# Patient Record
Sex: Female | Born: 1965 | Race: Black or African American | Hispanic: No | Smoking: Never smoker
Health system: Southern US, Community
[De-identification: ages and names within clinical notes are randomized; demographics above are authoritative.]

## PROBLEM LIST (undated history)

## (undated) ENCOUNTER — Ambulatory Visit: Payer: No Typology Code available for payment source

## (undated) DIAGNOSIS — E119 Type 2 diabetes mellitus without complications: Secondary | ICD-10-CM

## (undated) DIAGNOSIS — Z923 Personal history of irradiation: Secondary | ICD-10-CM

## (undated) DIAGNOSIS — R319 Hematuria, unspecified: Secondary | ICD-10-CM

## (undated) DIAGNOSIS — M1990 Unspecified osteoarthritis, unspecified site: Secondary | ICD-10-CM

## (undated) DIAGNOSIS — C50919 Malignant neoplasm of unspecified site of unspecified female breast: Secondary | ICD-10-CM

## (undated) DIAGNOSIS — T7840XA Allergy, unspecified, initial encounter: Secondary | ICD-10-CM

## (undated) DIAGNOSIS — I10 Essential (primary) hypertension: Secondary | ICD-10-CM

## (undated) DIAGNOSIS — I447 Left bundle-branch block, unspecified: Secondary | ICD-10-CM

## (undated) DIAGNOSIS — D649 Anemia, unspecified: Secondary | ICD-10-CM

## (undated) HISTORY — DX: Malignant neoplasm of unspecified site of unspecified female breast: C50.919

## (undated) HISTORY — DX: Hematuria, unspecified: R31.9

## (undated) HISTORY — DX: Anemia, unspecified: D64.9

## (undated) HISTORY — DX: Essential (primary) hypertension: I10

## (undated) HISTORY — DX: Unspecified osteoarthritis, unspecified site: M19.90

## (undated) HISTORY — DX: Allergy, unspecified, initial encounter: T78.40XA

---

## 1992-02-26 HISTORY — PX: TUBAL LIGATION: SHX77

## 2003-02-15 ENCOUNTER — Other Ambulatory Visit: Admission: RE | Admit: 2003-02-15 | Discharge: 2003-02-15 | Payer: Self-pay | Admitting: Gynecology

## 2004-07-18 ENCOUNTER — Other Ambulatory Visit: Admission: RE | Admit: 2004-07-18 | Discharge: 2004-07-18 | Payer: Self-pay | Admitting: Gynecology

## 2006-02-20 ENCOUNTER — Other Ambulatory Visit: Admission: RE | Admit: 2006-02-20 | Discharge: 2006-02-20 | Payer: Self-pay | Admitting: Gynecology

## 2007-07-14 ENCOUNTER — Other Ambulatory Visit: Admission: RE | Admit: 2007-07-14 | Discharge: 2007-07-14 | Payer: Self-pay | Admitting: Gynecology

## 2007-09-22 ENCOUNTER — Ambulatory Visit: Payer: Self-pay | Admitting: Internal Medicine

## 2007-09-22 DIAGNOSIS — R110 Nausea: Secondary | ICD-10-CM

## 2007-09-22 DIAGNOSIS — R0989 Other specified symptoms and signs involving the circulatory and respiratory systems: Secondary | ICD-10-CM

## 2007-09-29 ENCOUNTER — Telehealth: Payer: Self-pay | Admitting: Internal Medicine

## 2007-09-30 ENCOUNTER — Telehealth: Payer: Self-pay | Admitting: Internal Medicine

## 2007-09-30 ENCOUNTER — Ambulatory Visit: Payer: Self-pay | Admitting: Internal Medicine

## 2007-09-30 LAB — CONVERTED CEMR LAB
Alkaline Phosphatase: 38 units/L — ABNORMAL LOW (ref 39–117)
Basophils Absolute: 0 10*3/uL (ref 0.0–0.1)
Bilirubin, Direct: 0.1 mg/dL (ref 0.0–0.3)
Calcium: 9.3 mg/dL (ref 8.4–10.5)
Cholesterol: 170 mg/dL (ref 0–200)
Eosinophils Absolute: 0.1 10*3/uL (ref 0.0–0.7)
GFR calc Af Amer: 89 mL/min
GFR calc non Af Amer: 74 mL/min
HCT: 34.3 % — ABNORMAL LOW (ref 36.0–46.0)
HDL: 54.5 mg/dL (ref >39.0)
MCHC: 33.2 g/dL (ref 30.0–36.0)
MCV: 88.2 fL (ref 78.0–100.0)
Monocytes Absolute: 0.9 10*3/uL (ref 0.1–1.0)
Neutro Abs: 4 10*3/uL (ref 1.4–7.7)
Platelets: 276 10*3/uL (ref 150–400)
Potassium: 4.4 meq/L (ref 3.5–5.1)
RDW: 11.4 % — ABNORMAL LOW (ref 11.5–14.6)
Sodium: 139 meq/L (ref 135–145)
T3, Free: 2.8 pg/mL (ref 2.3–4.2)
TSH: 1.25 microintl units/mL (ref 0.35–5.50)
Total CHOL/HDL Ratio: 3.1
Triglycerides: 39 mg/dL (ref 0–149)

## 2007-10-01 ENCOUNTER — Ambulatory Visit: Payer: Self-pay

## 2007-10-05 ENCOUNTER — Telehealth: Payer: Self-pay | Admitting: Internal Medicine

## 2007-10-05 ENCOUNTER — Ambulatory Visit (HOSPITAL_BASED_OUTPATIENT_CLINIC_OR_DEPARTMENT_OTHER): Admission: RE | Admit: 2007-10-05 | Discharge: 2007-10-05 | Payer: Self-pay | Admitting: Internal Medicine

## 2007-10-06 ENCOUNTER — Ambulatory Visit: Payer: Self-pay | Admitting: Internal Medicine

## 2007-10-12 ENCOUNTER — Telehealth: Payer: Self-pay | Admitting: Internal Medicine

## 2007-11-03 ENCOUNTER — Ambulatory Visit: Payer: Self-pay | Admitting: Cardiovascular Disease

## 2007-11-06 ENCOUNTER — Ambulatory Visit: Payer: Self-pay | Admitting: Internal Medicine

## 2007-12-09 ENCOUNTER — Ambulatory Visit: Payer: Self-pay | Admitting: Cardiovascular Disease

## 2008-02-05 ENCOUNTER — Ambulatory Visit: Payer: Self-pay | Admitting: Internal Medicine

## 2008-08-09 ENCOUNTER — Ambulatory Visit (HOSPITAL_BASED_OUTPATIENT_CLINIC_OR_DEPARTMENT_OTHER): Admission: RE | Admit: 2008-08-09 | Discharge: 2008-08-09 | Payer: Self-pay | Admitting: Internal Medicine

## 2008-08-09 ENCOUNTER — Ambulatory Visit: Payer: Self-pay | Admitting: Internal Medicine

## 2008-08-09 ENCOUNTER — Telehealth: Payer: Self-pay | Admitting: Internal Medicine

## 2008-08-09 ENCOUNTER — Ambulatory Visit: Payer: Self-pay | Admitting: Diagnostic Radiology

## 2008-08-09 DIAGNOSIS — M79609 Pain in unspecified limb: Secondary | ICD-10-CM | POA: Insufficient documentation

## 2008-08-15 ENCOUNTER — Encounter: Admission: RE | Admit: 2008-08-15 | Discharge: 2008-08-15 | Payer: Self-pay | Admitting: Orthopaedic Surgery

## 2008-12-21 ENCOUNTER — Ambulatory Visit: Payer: Self-pay | Admitting: Diagnostic Radiology

## 2008-12-21 ENCOUNTER — Ambulatory Visit (HOSPITAL_BASED_OUTPATIENT_CLINIC_OR_DEPARTMENT_OTHER): Admission: RE | Admit: 2008-12-21 | Discharge: 2008-12-21 | Payer: Self-pay | Admitting: Internal Medicine

## 2009-03-17 ENCOUNTER — Ambulatory Visit: Payer: Self-pay | Admitting: Internal Medicine

## 2009-03-17 DIAGNOSIS — K5900 Constipation, unspecified: Secondary | ICD-10-CM | POA: Insufficient documentation

## 2009-03-17 DIAGNOSIS — J069 Acute upper respiratory infection, unspecified: Secondary | ICD-10-CM | POA: Insufficient documentation

## 2010-01-10 ENCOUNTER — Ambulatory Visit (HOSPITAL_BASED_OUTPATIENT_CLINIC_OR_DEPARTMENT_OTHER): Admission: RE | Admit: 2010-01-10 | Discharge: 2010-01-10 | Payer: Self-pay | Admitting: Internal Medicine

## 2010-01-10 ENCOUNTER — Ambulatory Visit: Payer: Self-pay | Admitting: Diagnostic Radiology

## 2010-03-19 ENCOUNTER — Encounter: Payer: Self-pay | Admitting: Internal Medicine

## 2010-03-27 NOTE — Assessment & Plan Note (Signed)
Summary: Cough, congestion, sore chest, lost voice- jr   Vital Signs:  Patient profile:   45 year old female Weight:      174.75 pounds BMI:     29.19 O2 Sat:      100 % on Room air Temp:     98.0 degrees F oral Pulse rate:   74 / minute Pulse rhythm:   regular Resp:     18 per minute BP sitting:   126 / 90  (right arm) Cuff size:   large  Vitals Entered By: Glendell Docker CMA (March 17, 2009 2:07 PM)  O2 Flow:  Room air  Primary Care Provider:  D. Thomos Lemons DO  CC:  Cough.  History of Present Illness:  Cough      This is a 45 year old woman who presents with Cough.  The patient reports productive cough and shortness of breath, but denies wheezing, fever, and hemoptysis.  Associated symtpoms include cold/URI symptoms, sore throat, and nasal congestion.  Ineffective prior treatments have included OTC cough medication.  lost her voice/hoarseness  Allergies (verified): No Known Drug Allergies  Past History:  Past Medical History: Remote thyroiditis GERD      Past Surgical History: Tubal ligation 1994         Family History: CAD - no Stoke - no DM -  sister Breast Ca - no Colon Ca - no Prostate Ca - no Htn - M, Brother Hyperlipidemia - M, Brother      Social History: Occupation:  Systems analyst Divorced - living with significant other 2 children ages 39 and 37 Never Smoked Alcohol use-yes        Physical Exam  General:  alert, well-developed, and well-nourished.   Mouth:  no exudates and pharyngeal erythema.   Neck:  supple, no masses, and no carotid bruits.   Lungs:  normal respiratory effort and normal breath sounds.   Heart:  normal rate, regular rhythm, and no gallop.     Impression & Recommendations:  Problem # 1:  URI (ICD-465.9)  Instructed on symptomatic treatment. Call if symptoms persist or worsen.   Patient Instructions: 1)  Call our office if your symptoms do not  improve or gets worse.  Current Allergies (reviewed  today): No known allergies

## 2010-07-10 NOTE — Assessment & Plan Note (Signed)
[facility]                            CARDIOLOGY OFFICE NOTE   NAME:[name] [name], [name] [name]                        MRN:          [id number]  DATE:11/03/2007                            DOB:          May 20, 1965    PERIPHERAL VASCULAR CLINIC NOTE   HISTORY OF PRESENT ILLNESS:  [name] [name] is a pleasant 45 year old  African American female with no significant past medical history, who  presents today for evaluation of carotid artery disease that was noted  by her primary care physician.  The patient states that she has been in  her normal state of good health and has had no problems other than  occasional mild swelling in her bilateral ankles.  She denies any  exertional chest pain or dyspnea.  She also denies any palpitations,  diaphoresis, dizziness, near-syncope, syncope, orthopnea, paroxysmal  nocturnal dyspnea, or nausea.  During a routine examination in her  primary care physician's office, she was noted to have a bruit in the  right carotid artery.  Subsequent noninvasive testing with bilateral  carotid Dopplers demonstrated possible fibromuscular dysplasia  bilaterally in the internal carotid arteries.  Vessel walls in the  external carotids were noted to be normal.  There was narrowing of the  mid and distal internal carotid arteries with some Doppler color beating  in elevated velocities, which could be suggestive of fibromuscular  dysplasia.  The vertebral arteries were patent with antegrade flow  bilaterally.  The patient has no other complaints at this time.   PAST MEDICAL HISTORY:  A remote thyroid problem that has since resolved.  She also has a history of gastroesophageal reflux disease.   PAST SURGICAL HISTORY:  Only significant for bilateral tubal ligation in  1994.   ALLERGIES:  No known drug allergies.   MEDICATION:  Omeprazole 20 mg twice daily.   SOCIAL HISTORY:  The patient denies the use of tobacco or illicit drugs.  She does use  alcohol on a social basis.  She has 2 children, is  divorced, and is employed as an Airline pilot.   FAMILY HISTORY:  There is no family history of sudden cardiac death,  premature coronary artery disease, or vascular disease that the patient  is aware of.  Her mother is alive and well at the age of 75.  Her father  died at the age of 52 in a car accident.  She has 5 siblings that are  all alive and well without any medical issues.   REVIEW OF SYSTEMS:  As stated in the history of present illness and is  otherwise negative.   PHYSICAL EXAMINATION:  GENERAL:  She is a pleasant, young [place]  American female, in no acute distress.  VITAL SIGNS:  Her weight is 160 pounds.  Blood pressure 120/84, pulse 61  and regular, and respirations 12 and nonlabored.  NECK:  No JVD.  No lymphadenopathy.  No thyromegaly.  I was unable to  auscultate any bruits over the right or left carotid arteries today.  HEENT:  Oropharynx clear.  Mucous membranes moist.  SKIN:  Warm and dry.  LUNGS:  Clear to auscultation bilaterally without wheezes, rhonchi, or  crackles noted.  CARDIOVASCULAR:  Regular rate and rhythm without murmurs, gallops, or  rubs noted.  No lifts or thrills noted.  ABDOMEN:  Soft and nontender.  Bowel sounds present.  EXTREMITIES:  No evidence of bilateral lower extremity edema.  The  dorsalis pedis and posterior tibial pulses are 2+ in the bilateral lower  extremities.  Radial artery pulses are 2+ bilaterally.   DIAGNOSTIC STUDIES:  1. A 12-lead electrocardiogram obtained in our office today shows      normal sinus rhythm with a ventricular rate of 61 beats per minute      and an incomplete right bundle branch block.  His EKG is otherwise      normal.  2. Carotid duplex examination from October 01, 2007, reveals smooth      vessel walls without plaque formation.  There is narrowing of the      mid and distal internal carotid arteries with some color Doppler      beating in elevated  velocities, which could indicate fibromuscular      dysplasia.  The vertebral arteries were patent with antegrade flow      bilaterally.  The internal carotid artery velocities suggest 60-79%      bilateral internal carotid artery stenosis.  The recommendation      from the study was a followup carotid CT angiogram.   ASSESSMENT AND PLAN:  This is a pleasant 45 year old [place] American  female with no significant past medical history, who was incidentally  found to have a right carotid bruit.  Workup of this physical  examination finding revealed possible fibromuscular dysplasia on the  bilateral carotid duplex examination.  At this time, the patient is  asymptomatic.  I have elected to follow up her duplex examination with a  carotid CT angiogram.  The patient will have this done and will return  to see me in my office in 2 to 3 weeks.  I will make no medication  changes during this visit.     Verne Carrow, MD  Electronically Signed    CM/MedQ  DD: 11/03/2007  DT: 11/04/2007  Job #: [id number]   cc:   Barbette Hair. Artist Pais, DO  Rollene Rotunda, MD, [facility]

## 2010-07-10 NOTE — Progress Notes (Signed)
HEALTHCARE                        PERIPHERAL VASCULAR OFFICE NOTE   NAME:[name], [name]                        MRN:          [id number]  DATE:12/09/2007                            DOB:          [birth year]    REFERRING PHYSICIAN:  Barbette Hair. Artist Pais, DO   HISTORY OF PRESENT ILLNESS:  [name] [name] is a pleasant 45 year old  African American female with no significant past medical history, who  was initially seen in our clinic on November 03, 2007, for further  evaluation of an abnormal carotid Doppler examination.  The patient had  been found to have a bruit in the right carotid artery during her  routine examination in her primary care physician's office.  Subsequent  noninvasive testing with bilateral carotid Dopplers demonstrated  possible fibromuscular dysplasia bilaterally in the internal carotid  arteries.  The vessel walls in the external carotids were noted to be  normal.  There was narrowing of the mid and distal internal carotid  arteries with some Doppler color beading in elevated velocities which  was felt to be suggestive of fibromuscular dysplasia.  The vertebral  arteries were patent with antegrade flow bilaterally.  The patient at  the time of her initial visit in September in our office had no  complaints.  She denied any palpitations, diaphoresis, dizziness, near  syncope, syncope, visual changes or headaches.  I elected at that time  to proceed with a CT angiogram of the neck which was performed on  November 06, 2007.  The patient returns today to review the results of  the study.  She is still without any complaints, states that she is in  her normal state of good health.   PAST MEDICAL HISTORY:  Unchanged and is only significant for her history  of gastroesophageal reflux disease.   CURRENT MEDICATIONS:  Omeprazole 20 mg twice daily.   REVIEW OF SYSTEMS:  As stated in the history of present illness and is  otherwise negative.   PHYSICAL EXAMINATION:  VITAL SIGNS:  Blood pressure 120/73, pulse 80 and  regular, respirations 12 and nonlabored, and weight 173 pounds.  GENERAL:  She is a pleasant young [place] American female who is alert  and oriented x3 and is in no acute distress.  PSYCHIATRIC:  Normal mood and affect.  NEUROLOGIC:  No focal deficits.  MUSCULOSKELETAL:  Muscle strength and tone is appropriate.  SKIN:  Warm and dry.  NECK:  No JVD.  No carotid bruits noted.  No lymphadenopathy.  No  thyromegaly.  LUNGS:  Clear to auscultation bilaterally without wheezes, rhonchi or  crackles noted.  CARDIOVASCULAR:  Regular rate and rhythm without murmurs, gallops or  rubs noted.  ABDOMEN:  Soft.  Bowel sounds present.  Nontender.  EXTREMITIES:  No evidence of edema.   DIAGNOSTIC STUDIES:  CT angiography of the neck performed on November 06, 2007, shows that the bilateral carotid and vertebral arteries are  small.  However, there is no focal irregularity to suggest fibromuscular  dysplasia.  There is no focal stenosis and no evidence of  atherosclerosis.  The right internal carotid artery  at the level of the  C1 ring measures 3.1 mm but appears normal otherwise.  The left internal  carotid artery at the level of the C1 ring measures 3.1 mm but is  otherwise normal.   ASSESSMENT AND PLAN:  This is a pleasant 44 year old [place] American  female with past medical history that is only significant for  gastroesophageal reflux disease who was seen initially for an abnormal  carotid Doppler examination.  A CT angiogram of her neck demonstrates  small internal carotid arteries bilaterally but no evidence of stenosis  or atherosclerosis.  There was also no evidence of fibromuscular  dysplasia.  I do not feel that any further workup is necessary at this  time.  I have told the patient that she should alert [place] should she have  any neurological symptoms including transient ischemic attack,  cerebrovascular accident,  change in her vision, headache, dizziness, or  syncopal episodes.  She will be seen in this office on an as needed  basis only.  She will continue to follow up with her primary care  physician Dr. Thomos Lemons as previously scheduled.     Verne Carrow, MD  Electronically Signed    CM/MedQ  DD: 12/09/2007  DT: 12/10/2007  Job #: [id number]   cc:   Barbette Hair. Artist Pais, DO  Luis Abed, MD, [facility]

## 2011-07-26 ENCOUNTER — Encounter: Payer: Self-pay | Admitting: Family

## 2011-07-26 ENCOUNTER — Ambulatory Visit (INDEPENDENT_AMBULATORY_CARE_PROVIDER_SITE_OTHER): Payer: BC Managed Care – PPO | Admitting: Family

## 2011-07-26 VITALS — BP 140/86 | HR 66 | Temp 98.1°F | Resp 12 | Ht 64.26 in | Wt 173.0 lb

## 2011-07-26 DIAGNOSIS — I10 Essential (primary) hypertension: Secondary | ICD-10-CM | POA: Insufficient documentation

## 2011-07-26 DIAGNOSIS — R42 Dizziness and giddiness: Secondary | ICD-10-CM

## 2011-07-26 LAB — CBC WITH DIFFERENTIAL/PLATELET
Basophils Absolute: 0 10*3/uL (ref 0.0–0.1)
Eosinophils Relative: 3 % (ref 0–5)
HCT: 35.5 % — ABNORMAL LOW (ref 36.0–46.0)
Lymphocytes Relative: 30 % (ref 12–46)
Lymphs Abs: 2 10*3/uL (ref 0.7–4.0)
MCV: 86.6 fL (ref 78.0–100.0)
Neutro Abs: 3.7 10*3/uL (ref 1.7–7.7)
Platelets: 370 10*3/uL (ref 150–400)
RBC: 4.1 MIL/uL (ref 3.87–5.11)
RDW: 12.7 % (ref 11.5–15.5)
WBC: 6.7 10*3/uL (ref 4.0–10.5)

## 2011-07-26 LAB — BASIC METABOLIC PANEL WITH GFR
CO2: 28 mEq/L (ref 19–32)
Calcium: 9 mg/dL (ref 8.4–10.5)
Chloride: 106 mEq/L (ref 96–112)
Creat: 0.84 mg/dL (ref 0.50–1.10)
GFR, Est Non African American: 85 mL/min
Sodium: 140 mEq/L (ref 135–145)

## 2011-07-26 MED ORDER — HYDROCHLOROTHIAZIDE 25 MG PO TABS: 25.0000 mg | ORAL_TABLET | Freq: Every day | ORAL | Status: DC

## 2011-07-26 NOTE — Patient Instructions (Addendum)
Please complete your lab work prior to leaving. Follow up in 1 month for blood pressure check and fasting physical.

## 2011-07-26 NOTE — Assessment & Plan Note (Signed)
As she has been symptomatic, reasonable to add HCTZ 25mg  daily and have pt follow back up in 1 month.

## 2011-07-26 NOTE — Progress Notes (Signed)
  Subjective:    Patient ID: [name] [name], female    DOB: 06/08/1965, 46 y.o.   MRN: [id number]  HPI  [name] is a 46 yr old female who presents today with chief complaint of elevated blood pressure. She reports that she has been monitoring her blood pressure at home for the last week and her sbp has been consistently running in the 140's.  She reports no personal hx of HTN but a strong family hx of HTN. Has been trying to watch her sodium.  Denies associated CP or SOB, but has had lightheadedness and dizziness.  Denies sinus pressure/drainage.   Review of Systems See HPI  History reviewed. No pertinent past medical history.  History   Social History  . Marital Status: Divorced    Spouse Name: N/A    Number of Children: 2  . Years of Education: N/A   Occupational History  .     Social History Main Topics  . Smoking status: Never Smoker   . Smokeless tobacco: Never Used  . Alcohol Use: 1.5 - 2.0 oz/week    3-4 drink(s) per week  . Drug Use: Not on file  . Sexually Active: Not on file   Other Topics Concern  . Not on file   Social History Narrative  . No narrative on file    Past Surgical History  Procedure Date  . Tubal ligation 1994    Family History  Problem Relation Age of Onset  . Hypertension Mother   . Heart attack Father   . Diabetes Sister   . Hypertension Brother   . Cancer Neg Hx   . Kidney disease Neg Hx   . Stroke Neg Hx     No Known Allergies  Current Outpatient Prescriptions on File Prior to Visit  Medication Sig Dispense Refill  . hydrochlorothiazide (HYDRODIURIL) 25 MG tablet Take 1 tablet (25 mg total) by mouth daily.  30 tablet  1    BP 140/86  Pulse 66  Temp(Src) 98.1 F (36.7 C) (Oral)  Resp 12  Ht 5' 4.26" (1.632 m)  Wt 173 lb (78.472 kg)  BMI 29.46 kg/m2  SpO2 99%  LMP 06/25/2011       Objective:   Physical Exam  Constitutional: She is oriented to person, place, and time. She appears well-developed and  well-nourished. No distress.  HENT:  Head: Normocephalic and atraumatic.  Mouth/Throat: No posterior oropharyngeal edema or posterior oropharyngeal erythema.       R TM occluded by cerumen.  L TM normal.  Neck:       Slightly prominent thyroid- symmetrical without palpable nodules.   Cardiovascular: Normal rate and regular rhythm.   No murmur heard. Pulmonary/Chest: Effort normal and breath sounds normal. No respiratory distress. She has no wheezes. She has no rales. She exhibits no tenderness.  Neurological: She is alert and oriented to person, place, and time.  Psychiatric: She has a normal mood and affect. Her behavior is normal. Judgment and thought content normal.          Assessment & Plan:

## 2011-07-26 NOTE — Assessment & Plan Note (Signed)
EKG is performed today and notes NSR.  Will obtain BMET, TSH, CBC.

## 2011-07-29 ENCOUNTER — Encounter: Payer: Self-pay | Admitting: Family

## 2011-08-30 ENCOUNTER — Encounter: Payer: BC Managed Care – PPO | Admitting: Family

## 2011-09-06 ENCOUNTER — Encounter: Payer: Self-pay | Admitting: Family

## 2011-09-06 ENCOUNTER — Ambulatory Visit (INDEPENDENT_AMBULATORY_CARE_PROVIDER_SITE_OTHER): Payer: BC Managed Care – PPO | Admitting: Family

## 2011-09-06 VITALS — BP 110/70 | HR 58 | Temp 98.6°F | Resp 16 | Ht 64.0 in | Wt 169.0 lb

## 2011-09-06 DIAGNOSIS — Z0000 Encounter for general adult medical examination without abnormal findings: Secondary | ICD-10-CM | POA: Insufficient documentation

## 2011-09-06 DIAGNOSIS — I10 Essential (primary) hypertension: Secondary | ICD-10-CM

## 2011-09-06 NOTE — Progress Notes (Signed)
Subjective:    Patient ID: [name] [name], female    DOB: 1965-05-30, 46 y.o.   MRN: [id number]  HPI  [name] is a 46 yr old female who presents today for her annual physical.  Mammogram November/december 2012- per GYN. She is trying to exercise 3 x a week- walks x 1 hour.  Diet is good. Switched to water from soda. Tetanus up to date.  Review of Systems  Constitutional: Negative for unexpected weight change.  HENT: Negative for hearing loss and congestion.   Eyes: Negative for visual disturbance.  Respiratory: Negative for cough and shortness of breath.   Cardiovascular: Negative for chest pain.  Gastrointestinal: Negative for nausea, vomiting and diarrhea.  Genitourinary: Negative for dysuria, frequency and menstrual problem.  Musculoskeletal: Negative for myalgias and arthralgias.  Skin: Negative for rash.  Neurological: Negative for headaches.  Hematological: Negative for adenopathy.  Psychiatric/Behavioral:       Denies depression/anxiety   No past medical history on file.  History   Social History  . Marital Status: Divorced    Spouse Name: N/A    Number of Children: 2  . Years of Education: N/A   Occupational History  .     Social History Main Topics  . Smoking status: Never Smoker   . Smokeless tobacco: Never Used  . Alcohol Use: 5.0 oz/week    10 drink(s) per week  . Drug Use: Not on file  . Sexually Active: Not on file   Other Topics Concern  . 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 Tubal ligation 1994    Family History  Problem Relation Age of Onset  . Hypertension Mother   . Heart attack Father   . Diabetes Sister   . Hypertension Brother   . Cancer Neg Hx   . Kidney disease Neg Hx   . Stroke Neg Hx     No Known Allergies  Current Outpatient Prescriptions on  File Prior to Visit  Medication Sig Dispense Refill  . Calcium Carbonate-Vitamin D (CALTRATE 600+D) 600-400 MG-UNIT per tablet Take 1 tablet by mouth 2 (two) times daily.      . hydrochlorothiazide (HYDRODIURIL) 25 MG tablet Take 1 tablet (25 mg total) by mouth daily.  30 tablet  1  . Iron Combinations (IRON COMPLEX PO) Take 1 tablet by mouth daily as needed.      . Multiple Vitamin (MULTIVITAMIN) tablet Take 1 tablet by mouth daily.        BP 110/70  Pulse 58  Temp 98.6 F (37 C) (Oral)  Resp 16  Ht 5\' 4"  (1.626 m)  Wt 169 lb (76.658 kg)  BMI 29.01 kg/m2  SpO2 97%  LMP 08/29/2011       Objective:   Physical Exam  Physical Exam  Constitutional: She is oriented to person, place, and time. She appears well-developed and well-nourished. No distress.  HENT:  Head: Normocephalic and atraumatic.  Right Ear: Tympanic membrane and ear canal normal.  Left Ear: Tympanic membrane and ear canal normal.  Mouth/Throat: Oropharynx is clear and moist.  Eyes: Pupils are equal, round, and reactive to light. No scleral icterus.  Neck: Normal range of motion. No thyromegaly present.  Cardiovascular: Normal rate and regular rhythm.   No murmur heard. Pulmonary/Chest: Effort normal and breath  sounds normal. No respiratory distress. He has no wheezes. She has no rales. She exhibits no tenderness.  Abdominal: Soft. Bowel sounds are normal. He exhibits no distension and no mass. There is no tenderness. There is no rebound and no guarding.  Musculoskeletal: She exhibits no edema.  Lymphadenopathy:    She has no cervical adenopathy.  Neurological: She is alert and oriented to person, place, and time. She exhibits normal muscle tone. Coordination normal.  Skin: Skin is warm and dry.  Psychiatric: She has a normal mood and affect. Her behavior is normal. Judgment and thought content normal.  Breast/pelvic- deferred to GYN    Assessment & Plan:

## 2011-09-06 NOTE — Patient Instructions (Addendum)
Please arrange follow up with GYN for mammogram and pap smear.   Keep up the good work with exercise.  Follow up in 6 months, sooner if problems/concerns.  Complete your blood work prior to leaving.

## 2011-09-06 NOTE — Assessment & Plan Note (Addendum)
BP Readings from Last 3 Encounters:  09/06/11 110/70  07/26/11 140/86  03/17/09 126/90  BP looks much better today on HCTZ- monitor.

## 2011-09-06 NOTE — Assessment & Plan Note (Signed)
Encouraged pt to continue exercise, healthy diet and weight loss.  She will schedule f/u pap/mammo with GYN this fall. Obtain fasting labs.  Immunizations up to date.

## 2011-09-07 LAB — LIPID PANEL
Cholesterol: 201 mg/dL — ABNORMAL HIGH (ref 0–200)
HDL: 59 mg/dL (ref >39)
Total CHOL/HDL Ratio: 3.4 Ratio
Triglycerides: 90 mg/dL (ref <150)

## 2011-09-07 LAB — BASIC METABOLIC PANEL WITH GFR
Chloride: 102 mEq/L (ref 96–112)
GFR, Est African American: 89 mL/min
GFR, Est Non African American: 77 mL/min
Potassium: 4.3 mEq/L (ref 3.5–5.3)
Sodium: 138 mEq/L (ref 135–145)

## 2011-09-07 LAB — URINALYSIS, ROUTINE W REFLEX MICROSCOPIC
Bilirubin Urine: NEGATIVE
Glucose, UA: NEGATIVE mg/dL
Hgb urine dipstick: NEGATIVE
Ketones, ur: NEGATIVE mg/dL
Leukocytes, UA: NEGATIVE
Protein, ur: NEGATIVE mg/dL
pH: 7 (ref 5.0–8.0)

## 2011-09-08 ENCOUNTER — Encounter: Payer: Self-pay | Admitting: Family

## 2012-03-13 ENCOUNTER — Encounter: Payer: Self-pay | Admitting: Family

## 2012-03-13 ENCOUNTER — Ambulatory Visit (INDEPENDENT_AMBULATORY_CARE_PROVIDER_SITE_OTHER): Payer: BC Managed Care – PPO | Admitting: Family

## 2012-03-13 VITALS — BP 130/90 | HR 64 | Temp 98.7°F | Resp 16 | Ht 64.0 in | Wt 169.0 lb

## 2012-03-13 DIAGNOSIS — R110 Nausea: Secondary | ICD-10-CM

## 2012-03-13 DIAGNOSIS — I10 Essential (primary) hypertension: Secondary | ICD-10-CM

## 2012-03-13 LAB — CBC WITH DIFFERENTIAL/PLATELET
Basophils Relative: 0 % (ref 0–1)
Eosinophils Relative: 5 % (ref 0–5)
HCT: 37.1 % (ref 36.0–46.0)
Hemoglobin: 12.2 g/dL (ref 12.0–15.0)
MCHC: 32.9 g/dL (ref 30.0–36.0)
MCV: 86.5 fL (ref 78.0–100.0)
Monocytes Absolute: 0.9 10*3/uL (ref 0.1–1.0)
Monocytes Relative: 12 % (ref 3–12)
Neutro Abs: 3.5 10*3/uL (ref 1.7–7.7)
RDW: 12.8 % (ref 11.5–15.5)

## 2012-03-13 LAB — BASIC METABOLIC PANEL
BUN: 11 mg/dL (ref 6–23)
Chloride: 102 mEq/L (ref 96–112)
Creat: 0.91 mg/dL (ref 0.50–1.10)
Glucose, Bld: 94 mg/dL (ref 70–99)
Potassium: 4.3 mEq/L (ref 3.5–5.3)

## 2012-03-13 LAB — HEPATIC FUNCTION PANEL
ALT: 16 U/L (ref 0–35)
AST: 15 U/L (ref 0–37)
Albumin: 4.3 g/dL (ref 3.5–5.2)
Total Bilirubin: 0.4 mg/dL (ref 0.3–1.2)

## 2012-03-13 MED ORDER — HYDROCHLOROTHIAZIDE 25 MG PO TABS: 25.0000 mg | ORAL_TABLET | Freq: Every day | ORAL | Status: DC

## 2012-03-13 MED ORDER — OMEPRAZOLE 40 MG PO CPDR: 40.0000 mg | DELAYED_RELEASE_CAPSULE | Freq: Every day | ORAL | Status: DC

## 2012-03-13 NOTE — Assessment & Plan Note (Addendum)
New. Obtain cbc, lft and abdominal US to evaluate gallbladder.  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  Recommended trial of omeprazole and that she avoid use of NSAIDS and instead use tylenol prn.

## 2012-03-13 NOTE — Assessment & Plan Note (Signed)
Obtain bmet, resume hctz.

## 2012-03-13 NOTE — Patient Instructions (Addendum)
Please schedule your abdominal ultrasound on the first floor.  Complete the blood work prior to leaving.  Call if nausea worsens or if no improvement in 2-3 days. Follow up in 3 months.

## 2012-03-13 NOTE — Progress Notes (Signed)
  Subjective:    Patient ID: [name] [name], female    DOB: September 08, 1965, 47 y.o.   MRN: [id number]  HPI  [name] is a 47 yr old female who presents today for follow up.  1) HTN- she has been out of hctz for several months. Working on low sodium diet, increasing fruits/veggies.  2) Nausea-  Pt reports post-prandial nausea. Denies gerd. No vomitting.  Denies abdominal pain.  She reports that she has been using NSAIDS regularly due to some hip pain that she has been having.     Review of Systems    see HPI  No past medical history on file.  History   Social History  . Marital Status: Divorced    Spouse Name: N/A    Number of Children: 2  . Years of Education: N/A   Occupational History  .     Social History Main Topics  . Smoking status: Never Smoker   . Smokeless tobacco: Never Used  . Alcohol Use: 5.0 oz/week    10 drink(s) per week  . Drug Use: Not on file  . Sexually Active: Not on file   Other Topics Concern  . Not on file   Social History Narrative   REGULAR EXERCISE:  3 X WEEKLYCAFFEINE USE:  NOShe works at [employer]- senior accountantDivorced2 children (age 79 and 70)    Past Surgical History  Procedure Date  . Tubal ligation 1994    Family History  Problem Relation Age of Onset  . Hypertension Mother   . Heart attack Father   . Diabetes Sister   . Hypertension Brother   . Cancer Neg Hx   . Kidney disease Neg Hx   . Stroke Neg Hx     No Known Allergies  Current Outpatient Prescriptions on File Prior to Visit  Medication Sig Dispense Refill  . Calcium Carbonate-Vitamin D (CALTRATE 600+D) 600-400 MG-UNIT per tablet Take 1 tablet by mouth 2 (two) times daily.      . Iron Combinations (IRON COMPLEX PO) Take 1 tablet by mouth daily as needed.      . Multiple Vitamin (MULTIVITAMIN) tablet Take 1 tablet by mouth daily.      . hydrochlorothiazide (HYDRODIURIL) 25 MG tablet Take 1 tablet (25 mg total) by mouth daily.  30 tablet  1    BP  130/90  Pulse 64  Temp 98.7 F (37.1 C) (Oral)  Resp 16  Ht 5\' 4"  (1.626 m)  Wt 169 lb (76.658 kg)  BMI 29.01 kg/m2  SpO2 99%  LMP 03/13/2012    Objective:   Physical Exam  Constitutional: She is oriented to person, place, and time. She appears well-developed and well-nourished. No distress.  HENT:  Head: Normocephalic and atraumatic.  Cardiovascular: Normal rate and regular rhythm.   No murmur heard. Pulmonary/Chest: Effort normal and breath sounds normal. No respiratory distress. She has no wheezes. She has no rales. She exhibits no tenderness.  Abdominal: Soft. Bowel sounds are normal. She exhibits no distension and no mass. There is no tenderness. There is no rebound and no guarding.  Musculoskeletal: She exhibits no edema.  Neurological: She is alert and oriented to person, place, and time.  Skin: Skin is warm and dry.  Psychiatric: She has a normal mood and affect. Her behavior is normal. Judgment and thought content normal.          Assessment & Plan:

## 2012-03-17 ENCOUNTER — Ambulatory Visit (HOSPITAL_BASED_OUTPATIENT_CLINIC_OR_DEPARTMENT_OTHER)
Admission: RE | Admit: 2012-03-17 | Discharge: 2012-03-17 | Disposition: A | Discharge status: Home / Self Care | Payer: BC Managed Care – PPO | Source: Ambulatory Visit | Attending: Family | Admitting: Family

## 2012-03-17 DIAGNOSIS — R110 Nausea: Secondary | ICD-10-CM | POA: Insufficient documentation

## 2012-06-08 ENCOUNTER — Ambulatory Visit: Payer: BC Managed Care – PPO | Admitting: Family

## 2012-07-07 ENCOUNTER — Other Ambulatory Visit: Payer: Self-pay | Admitting: Gynecology

## 2012-07-07 DIAGNOSIS — R928 Other abnormal and inconclusive findings on diagnostic imaging of breast: Secondary | ICD-10-CM

## 2012-07-22 ENCOUNTER — Ambulatory Visit
Admission: RE | Admit: 2012-07-22 | Discharge: 2012-07-22 | Disposition: A | Discharge status: Home / Self Care | Payer: BC Managed Care – PPO | Source: Ambulatory Visit | Attending: Gynecology | Admitting: Gynecology

## 2012-07-22 DIAGNOSIS — R928 Other abnormal and inconclusive findings on diagnostic imaging of breast: Secondary | ICD-10-CM

## 2013-01-08 ENCOUNTER — Other Ambulatory Visit: Payer: Self-pay | Admitting: Gynecology

## 2013-01-08 DIAGNOSIS — N6002 Solitary cyst of left breast: Secondary | ICD-10-CM

## 2013-01-26 ENCOUNTER — Other Ambulatory Visit: Payer: Self-pay | Admitting: Gynecology

## 2013-01-26 ENCOUNTER — Ambulatory Visit
Admission: RE | Admit: 2013-01-26 | Discharge: 2013-01-26 | Disposition: A | Discharge status: Home / Self Care | Payer: Federal, State, Local not specified - PPO | Source: Ambulatory Visit | Attending: Gynecology | Admitting: Gynecology

## 2013-01-26 DIAGNOSIS — N6002 Solitary cyst of left breast: Secondary | ICD-10-CM

## 2014-04-12 ENCOUNTER — Ambulatory Visit (INDEPENDENT_AMBULATORY_CARE_PROVIDER_SITE_OTHER): Payer: No Typology Code available for payment source | Admitting: Family

## 2014-04-12 ENCOUNTER — Telehealth: Payer: Self-pay | Admitting: Family

## 2014-04-12 ENCOUNTER — Ambulatory Visit: Payer: Federal, State, Local not specified - PPO | Admitting: Family

## 2014-04-12 ENCOUNTER — Encounter: Payer: Self-pay | Admitting: Family

## 2014-04-12 VITALS — BP 118/84 | HR 67 | Temp 98.2°F | Resp 16 | Ht 65.0 in | Wt 177.6 lb

## 2014-04-12 DIAGNOSIS — R319 Hematuria, unspecified: Secondary | ICD-10-CM

## 2014-04-12 DIAGNOSIS — R1032 Left lower quadrant pain: Secondary | ICD-10-CM | POA: Diagnosis not present

## 2014-04-12 LAB — POCT URINALYSIS DIPSTICK
BILIRUBIN UA: NEGATIVE
GLUCOSE UA: NEGATIVE
Ketones, UA: NEGATIVE
Leukocytes, UA: NEGATIVE
NITRITE UA: NEGATIVE
Protein, UA: NEGATIVE
Spec Grav, UA: >=1.03
Urobilinogen, UA: NEGATIVE
pH, UA: 6

## 2014-04-12 LAB — POCT URINE HCG BY VISUAL COLOR COMPARISON TESTS: Preg Test, Ur: NEGATIVE

## 2014-04-12 MED ORDER — MELOXICAM 7.5 MG PO TABS: 7.5000 mg | ORAL_TABLET | Freq: Every day | ORAL | Status: DC

## 2014-04-12 NOTE — Progress Notes (Signed)
Subjective:    Patient ID: [name] [name], female    DOB: September 17, 1965, 49 y.o.   MRN: [id number]  HPI  Patient here with chief complaint of LLQ pain. Present x 2 years, worsening. Saw her GYN reports that he completed pelvic ultrasound and that this was unremarkable except that her uterus was touching her uterus.  Also has hx of microscopic hematuria- saw [facility].  She reports that she last saw them in October and she has an annual follow up scheduled. Most recent urine with urology was negative for blood.  LLQ pain is intermittent.  She reports that it hurts more when her cycle is "not on."  This AM her pain was 7/10, sharp throbbing pain.  Pain is not worsened by movements. Unable to lay flat on her abdomen when she sleeps. Worse in the AM's.  Denies gross hematuria.  She reports that she has intermittent Left low back pain. Sometimes she has some left leg "aching" in the front of her upper leg. She did have MRI  07/2008-  Noted to have L4-5 and L5-S1.    Past Medical History  Diagnosis Date  . Hematuria     Review of Systems    see HPI  Past Medical History  Diagnosis Date  . Hematuria     History   Social History  . Marital Status: Divorced    Spouse Name: N/A  . Number of Children: 2  . Years of Education: N/A   Occupational History  .     Social History Main Topics  . Smoking status: Never Smoker   . Smokeless tobacco: Never Used  . Alcohol Use: 5.0 oz/week    10 drink(s) per week  . Drug Use: Not on file  . Sexual Activity: Not on file   Other Topics Concern  . 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 Tubal ligation  1994    Family History  Problem Relation Age of Onset  . Hypertension Mother   . Heart attack Father   . Diabetes  Sister   . Hypertension Brother   . Cancer Neg Hx   . Kidney disease Neg Hx   . Stroke Neg Hx   . Hyperlipidemia Sister   . Urolithiasis Brother     No Known Allergies  Current Outpatient Prescriptions on File Prior to Visit  Medication Sig Dispense Refill  . Multiple Vitamin (MULTIVITAMIN) tablet Take 1 tablet by mouth daily.    . hydrochlorothiazide (HYDRODIURIL) 25 MG tablet Take 1 tablet (25 mg total) by mouth daily. 30 tablet 2   No current facility-administered medications on file prior to visit.    BP 118/84 mmHg  Pulse 67  Temp(Src) 98.2 F (36.8 C) (Oral)  Resp 16  Ht  (1.651 m)  Wt 177 lb 9.6 oz (80.559 kg)  BMI 29.55 kg/m2  SpO2 99%  LMP 04/07/2014    Objective:    Physical Exam  Constitutional: She is oriented to person, place, and time. She appears well-developed and well-nourished.  Cardiovascular: Normal rate, regular rhythm and normal heart sounds.   No murmur heard. Pulmonary/Chest: Effort normal and breath sounds normal. No respiratory distress.  She has no wheezes.  Abdominal: There is no tenderness. There is no rigidity, no rebound and no guarding.  Genitourinary:  Neg CVAT bilaterally  Musculoskeletal:       Thoracic back: She exhibits no tenderness.       Lumbar back: She exhibits no tenderness.  Bilateral LE strength is 5/5  Neurological: She is alert and oriented to person, place, and time.  Reflex Scores:      Patellar reflexes are 2+ on the right side and 2+ on the left side. Psychiatric: She has a normal mood and affect. Her behavior is normal. Judgment and thought content normal.    BP 118/84 mmHg  Pulse 67  Temp(Src) 98.2 F (36.8 C) (Oral)  Resp 16  Ht 5\' 5"  (1.651 m)  Wt 177 lb 9.6 oz (80.559 kg)  BMI 29.55 kg/m2  SpO2 99%  LMP 04/07/2014 Wt Readings from Last 3 Encounters:  04/12/14 177 lb 9.6 oz (80.559 kg)  03/13/12 169 lb (76.658 kg)  09/06/11 169 lb (76.658 kg)       Assessment & Plan:      Lemont Fillers'SULLIVAN,Dezirea Mccollister S., NP

## 2014-04-12 NOTE — Progress Notes (Signed)
Pre visit review using our clinic review tool, if applicable. No additional management support is needed unless otherwise documented below in the visit note. 

## 2014-04-12 NOTE — Assessment & Plan Note (Signed)
+   microscopic hematuria on dip today. Will obtain CT abdomen and pelvis.  Differential includes:  Constipation, ovarian cyst, lumbar disc disease, kidney stone.  If CT unremarkable, consider repeat MRI Lumbar spine.  In the meantime, will send urine for culture to rule out infection and give trial of meloxicam.

## 2014-04-12 NOTE — Patient Instructions (Signed)
You will be contacted about your CT scan. You may start meloxicam as needed for pain. Follow up in 1 month.

## 2014-04-12 NOTE — Telephone Encounter (Signed)
Please schedule CT abdomen for 2/17. Thanks.

## 2014-04-13 NOTE — Telephone Encounter (Signed)
Still awaiting insurance auth

## 2014-04-14 ENCOUNTER — Encounter: Payer: Self-pay | Admitting: Family

## 2014-04-14 LAB — URINE CULTURE
Colony Count: NO GROWTH
Organism ID, Bacteria: NO GROWTH

## 2014-04-15 ENCOUNTER — Other Ambulatory Visit (HOSPITAL_BASED_OUTPATIENT_CLINIC_OR_DEPARTMENT_OTHER): Payer: No Typology Code available for payment source

## 2014-04-15 ENCOUNTER — Ambulatory Visit (HOSPITAL_BASED_OUTPATIENT_CLINIC_OR_DEPARTMENT_OTHER)
Admission: RE | Admit: 2014-04-15 | Discharge: 2014-04-15 | Disposition: A | Discharge status: Home / Self Care | Payer: No Typology Code available for payment source | Source: Ambulatory Visit | Attending: Family | Admitting: Family

## 2014-04-15 DIAGNOSIS — R1032 Left lower quadrant pain: Secondary | ICD-10-CM | POA: Diagnosis not present

## 2014-04-15 DIAGNOSIS — R319 Hematuria, unspecified: Secondary | ICD-10-CM | POA: Insufficient documentation

## 2014-04-15 NOTE — Telephone Encounter (Signed)
Could you please call insurance and follow up on CT status? thanks

## 2014-04-15 NOTE — Telephone Encounter (Signed)
Insurance approved and has appointment scheduled for today

## 2014-04-18 IMAGING — MG MM DIGITAL DIAGNOSTIC UNILAT*L*
3 series · 3 of 3 positions shown · non-contrast
Comparison: Prior exams

CLINICAL DATA: Screening callback for questioned left breast mass

DIGITAL DIAGNOSTIC LEFT MAMMOGRAM WITHOUT CAD AND LEFT BREAST
ULTRASOUND:

[L CC]
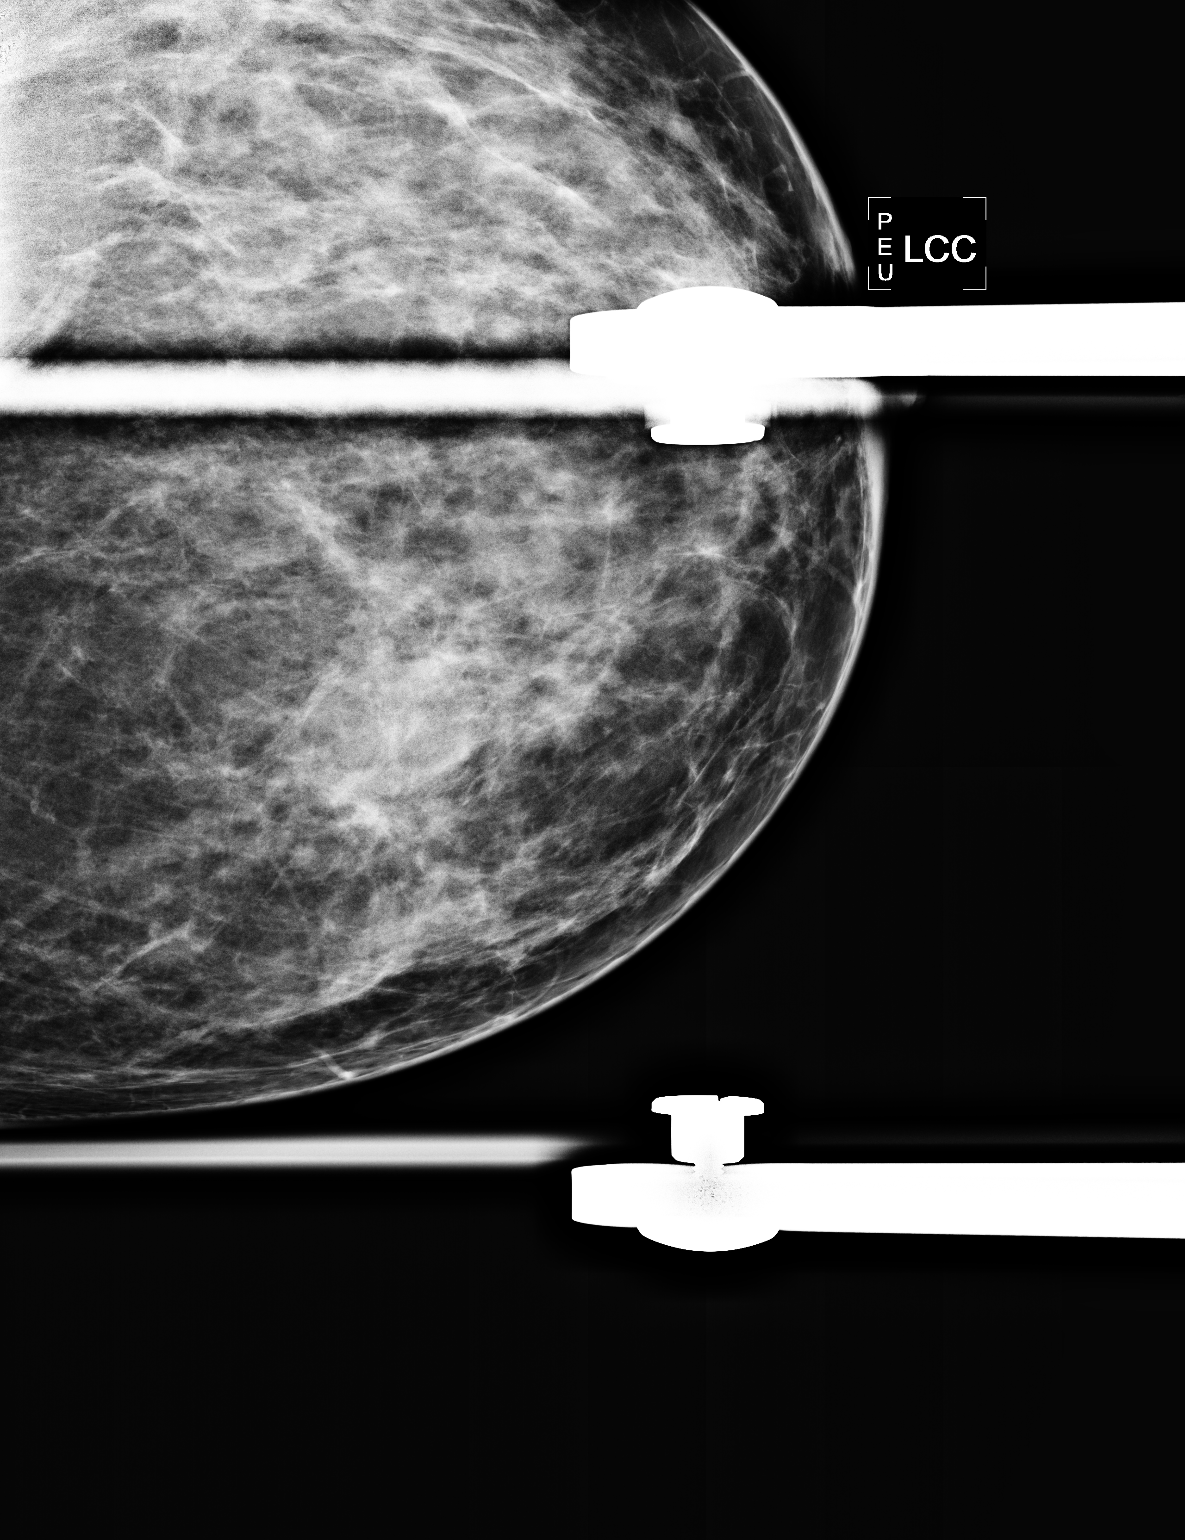

[L MLO (1 of 2)]
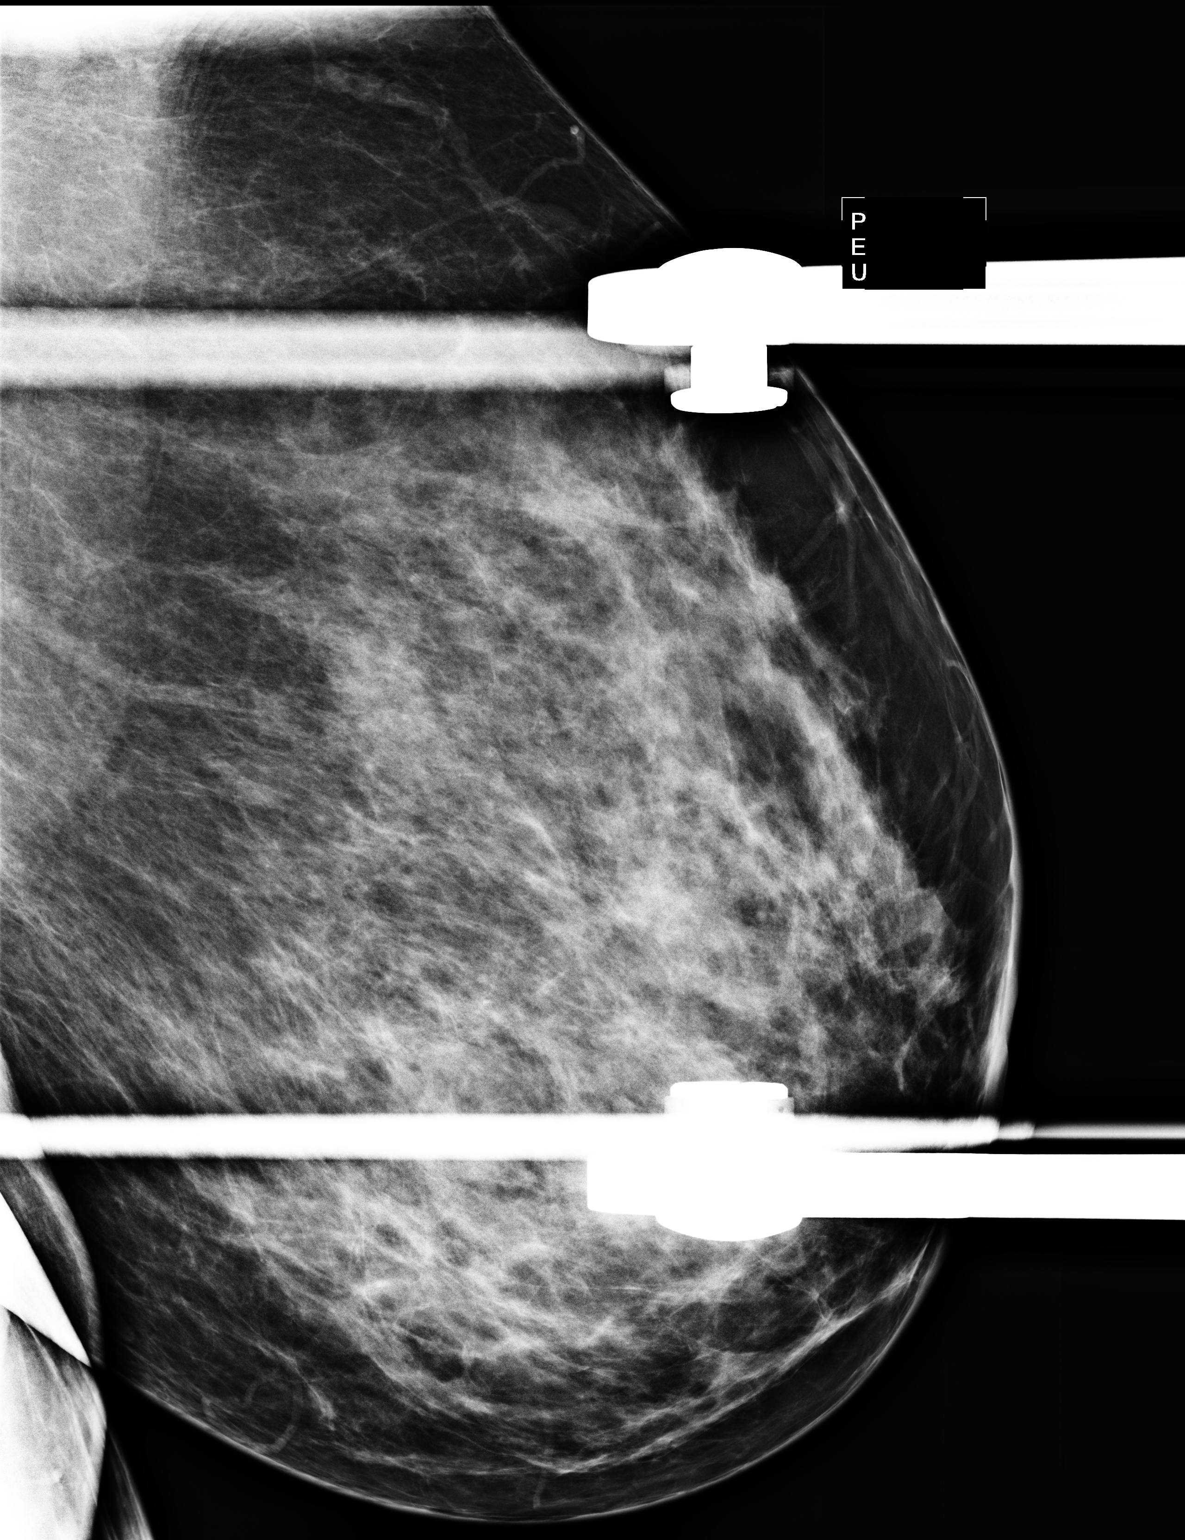

[L MLO (2 of 2)]
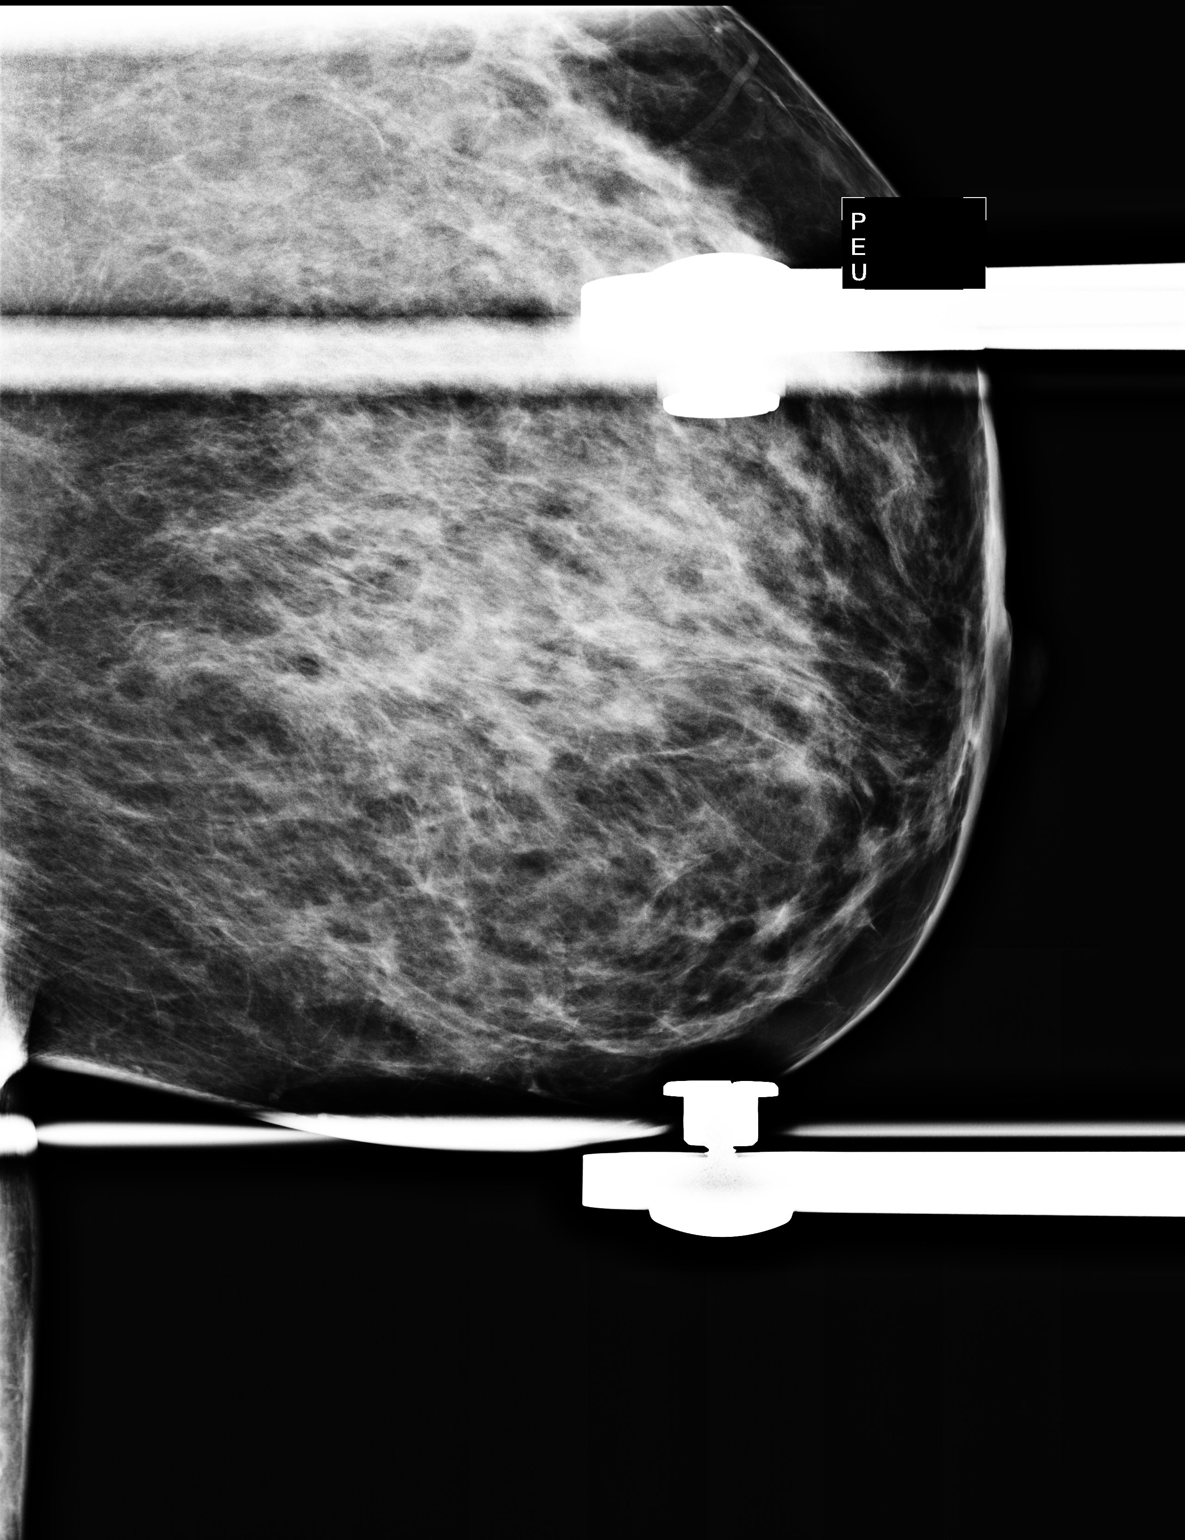

[3 of 3 positions shown; findings below may reference images not displayed]

FINDINGS: ACR Breast Density Category 4: The breast tissue is extremely
dense.

Additional views confirm the presence of an oval circumscribed
obscured 2.1 cm mass left breast nine o'clock location,
corresponding to the screening mammographic finding.

On physical exam, I palpate no abnormality in the left medial
breast.

Ultrasound is performed, showing a hypoechoic oval circumscribed
mass left breast nine o'clock location 5 cm from the nipple
measuring 1.9 x 1.6 x 1.3 cm.  Minimal internal echoes are noted
without solid component, which is felt to most likely be
artifactual due to adjacent fatty tissue and position of the mass
within the breast tissue.
IMPRESSION: Probable left breast cyst, nine o'clock location.  Internal echoes
are most likely artifactual, but 6-month follow-up left diagnostic
mammogram and possibly ultrasound are recommended for surveillance.

RECOMMENDATION:
Left diagnostic mammogram in 6 months

I have discussed the findings and recommendations with the patient.
Results were also provided in writing at the conclusion of the
visit.  If applicable, a reminder letter will be sent to the
patient regarding the next appointment.

BI-RADS CATEGORY 3:  Probably benign finding(s) - short interval
follow-up suggested.

## 2014-05-02 ENCOUNTER — Encounter: Payer: Self-pay | Admitting: Family

## 2014-05-02 ENCOUNTER — Ambulatory Visit (INDEPENDENT_AMBULATORY_CARE_PROVIDER_SITE_OTHER): Payer: No Typology Code available for payment source | Admitting: Family

## 2014-05-02 VITALS — BP 122/82 | HR 62 | Temp 97.9°F | Resp 16 | Ht 65.0 in | Wt 177.4 lb

## 2014-05-02 DIAGNOSIS — R1032 Left lower quadrant pain: Secondary | ICD-10-CM

## 2014-05-02 NOTE — Assessment & Plan Note (Signed)
Likely physiologic cyst which has now resolved.  Monitor.

## 2014-05-02 NOTE — Patient Instructions (Signed)
Please schedule a follow up appointment in 6 months.   

## 2014-05-02 NOTE — Progress Notes (Signed)
Pre visit review using our clinic review tool, if applicable. No additional management support is needed unless otherwise documented below in the visit note. 

## 2014-05-02 NOTE — Progress Notes (Signed)
   Subjective:    Patient ID: [name] [name], female    DOB: Aug 08, 1965, 49 y.o.   MRN: [id number]  HPI  [name] is a 49 yr old female who presents today for follow up of her LLQ abdominal pain.  CT was performed and noted left ovarian cyst.  She reports resolution of pain.    Review of Systems See HPI  Past Medical History  Diagnosis Date  . Hematuria     History   Social History  . Marital Status: Divorced    Spouse Name: N/A  . Number of Children: 2  . Years of Education: N/A   Occupational History  .     Social History Main Topics  . Smoking status: Never Smoker   . Smokeless tobacco: Never Used  . Alcohol Use: 5.0 oz/week    10 drink(s) per week  . Drug Use: Not on file  . Sexual Activity: Not on file   Other Topics Concern  . 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 Tubal ligation  1994    Family History  Problem Relation Age of Onset  . Hypertension Mother   . Heart attack Father   . Diabetes Sister   . Hypertension Brother   . Cancer Neg Hx   . Kidney disease Neg Hx   . Stroke Neg Hx   . Hyperlipidemia Sister   . Urolithiasis Brother     No Known Allergies  Current Outpatient Prescriptions on File Prior to Visit  Medication Sig Dispense Refill  . hydrochlorothiazide (HYDRODIURIL) 25 MG tablet Take 1 tablet (25 mg total) by mouth daily. 30 tablet 2  . meloxicam (MOBIC) 7.5 MG tablet Take 1 tablet (7.5 mg total) by mouth daily. 14 tablet 0  . Multiple Vitamin (MULTIVITAMIN) tablet Take 1 tablet by mouth daily.     No current facility-administered medications on file prior to visit.    BP 122/82 mmHg  Pulse 62  Temp(Src) 97.9 F (36.6 C) (Oral)  Resp 16  Ht 5\' 5"  (1.651 m)  Wt 177 lb 6.4 oz (80.468 kg)  BMI 29.52 kg/m2   SpO2 99%  LMP 04/29/2014       Objective:   Physical Exam  Constitutional: She appears well-developed and well-nourished. No distress.  Cardiovascular: Normal rate and regular rhythm.   No murmur heard. Pulmonary/Chest: Effort normal and breath sounds normal. No respiratory distress. She has no wheezes. She has no rales. She exhibits no tenderness.  Neurological: She is alert.  Psychiatric: She has a normal mood and affect. Her behavior is normal. Thought content normal.          Assessment & Plan:

## 2014-10-23 IMAGING — MG MM DIAGNOSTIC UNILATERAL L
2 series · 2 of 2 positions shown · non-contrast
Comparison: Multiple priors

CLINICAL DATA: Followup probably benign left breast mass.

EXAM:
DIGITAL DIAGNOSTIC  LEFT MAMMOGRAM WITH CAD
ULTRASOUND LEFT BREAST

[L MLO]
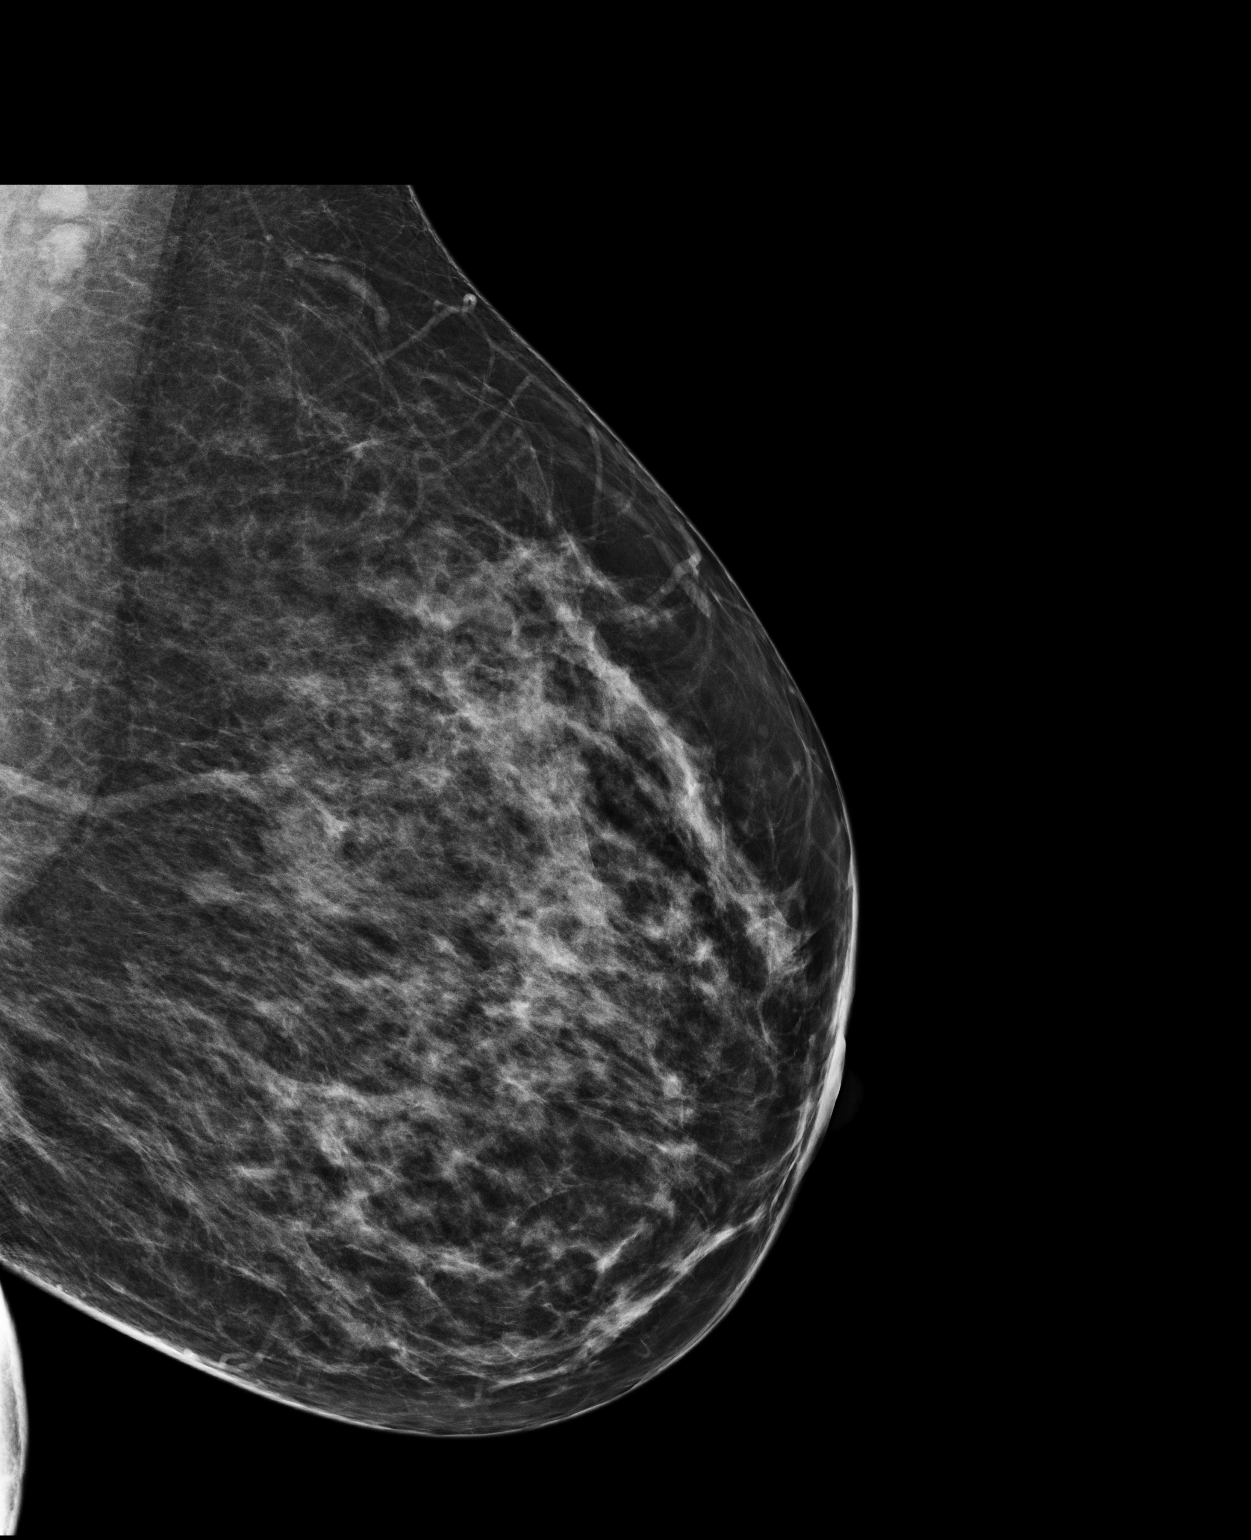

[L CC]
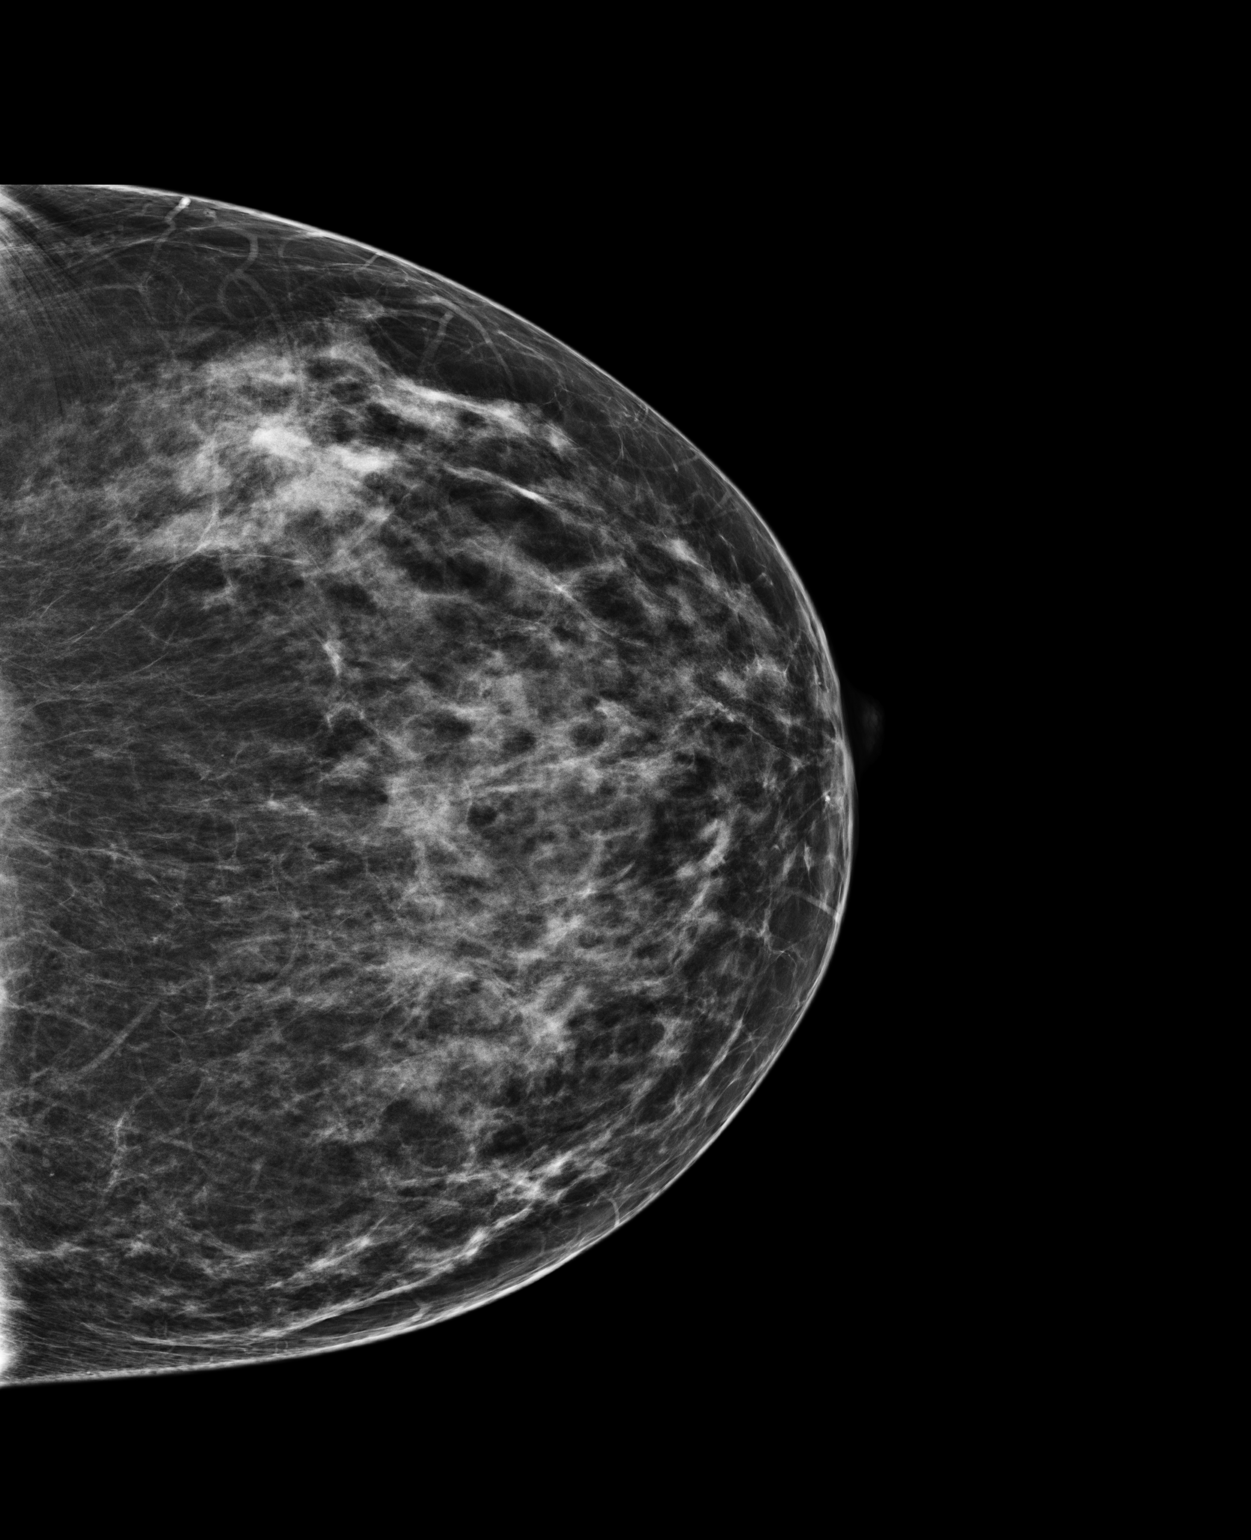

[2 of 2 positions shown; findings below may reference images not displayed]

ACR Breast Density Category b: There are scattered areas of
fibroglandular density.
FINDINGS: The previously seen mass at [DATE] on the left breast is less apparent
mammographically.

Mammographic images were processed with CAD.

On physical exam,no palpable abnormality is identified in the inner
central left breast.

Targeted ultrasound demonstrates a circumscribed hypoechoic oval
mass at 9 o'clock, 5 cm from the nipple, measuring 5 x 3 x 8 mm.
This has significantly decreased in size from the prior examination
in which it measured 1.9 x 1.4 x 1.6 cm.
IMPRESSION: Significant decrease in size of left breast mass, indicating that it
is benign and likely represents a cyst.

RECOMMENDATION:
Bilateral screening mammogram in June 2013.

I have discussed the findings and recommendations with the patient.
Results were also provided in writing at the conclusion of the
visit.

BI-RADS CATEGORY  2: Benign Finding(s)

## 2014-11-01 ENCOUNTER — Encounter: Payer: Self-pay | Admitting: Family

## 2014-11-01 ENCOUNTER — Ambulatory Visit (INDEPENDENT_AMBULATORY_CARE_PROVIDER_SITE_OTHER): Payer: PRIVATE HEALTH INSURANCE | Admitting: Family

## 2014-11-01 VITALS — BP 130/84 | HR 68 | Temp 98.1°F | Resp 16 | Ht 65.0 in | Wt 177.0 lb

## 2014-11-01 DIAGNOSIS — R3129 Other microscopic hematuria: Secondary | ICD-10-CM | POA: Insufficient documentation

## 2014-11-01 DIAGNOSIS — R312 Other microscopic hematuria: Secondary | ICD-10-CM | POA: Diagnosis not present

## 2014-11-01 DIAGNOSIS — R1032 Left lower quadrant pain: Secondary | ICD-10-CM

## 2014-11-01 DIAGNOSIS — I10 Essential (primary) hypertension: Secondary | ICD-10-CM

## 2014-11-01 DIAGNOSIS — Z0000 Encounter for general adult medical examination without abnormal findings: Secondary | ICD-10-CM

## 2014-11-01 DIAGNOSIS — Z23 Encounter for immunization: Secondary | ICD-10-CM | POA: Diagnosis not present

## 2014-11-01 NOTE — Progress Notes (Signed)
   Subjective:    Patient ID: [name] [name], female    DOB: [birth year], 49 y.o.   MRN: [id number]  HPI  Ms. Ouderkirk is a 49 yr old female who presents today for follow up. Back in February she noted LLQ abdominal pain and note was made of microscopic hematuria. A CT abd/pelvis was ordered.  Kidneys were normal, bladder appeared normal.  Note was made of probably Left ovarian cyst.   She was seen back in the office in March and noted resolution of the abdominal pain.    She has a follow up with urology for microscopic hematuria in October.   Mild HTN- She is not taking HCTZ.  She is not exericising.   Review of Systems See HPI  Past Medical History  Diagnosis Date  . Hematuria     Social History   Social History  . Marital Status: Divorced    Spouse Name: N/A  . Number of Children: 2  . Years of Education: N/A   Occupational History  .     Social History Main Topics  . Smoking status: Never Smoker   . Smokeless tobacco: Never Used  . Alcohol Use: 5.0 oz/week    10 drink(s) per week  . Drug Use: Not on file  . Sexual Activity: Not on file   Other Topics Concern  . 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 Tubal ligation  1994    Family History  Problem Relation Age of Onset  . Hypertension Mother   . Heart attack Father   . Diabetes Sister   . Hypertension Brother   . Cancer Neg Hx   . Kidney disease Neg Hx   . Stroke Neg Hx   . Hyperlipidemia Sister   . Urolithiasis Brother     No Known Allergies  Current Outpatient Prescriptions on File Prior to Visit  Medication Sig Dispense Refill  . hydrochlorothiazide (HYDRODIURIL) 25 MG tablet Take 1 tablet (25 mg total) by mouth daily. 30 tablet 2  . Multiple Vitamin (MULTIVITAMIN) tablet Take 1  tablet by mouth daily.     No current facility-administered medications on file prior to visit.    BP 130/84 mmHg  Pulse 68  Temp(Src) 98.1 F (36.7 C) (Oral)  Resp 16  Ht  (1.651 m)  Wt 177 lb (80.287 kg)  BMI 29.45 kg/m2  SpO2 100%  LMP 10/29/2014       Objective:   Physical Exam  Constitutional: She is oriented to person, place, and time. She appears well-developed and well-nourished.  HENT:  Head: Normocephalic and atraumatic.  Cardiovascular: Normal rate, regular rhythm and normal heart sounds.   No murmur heard. Pulmonary/Chest: Effort normal and breath sounds normal. No respiratory distress. She has no wheezes.  Neurological: She is alert and oriented to person, place, and time.  Psychiatric: She has a normal mood and affect. Her behavior is normal. Judgment and thought content normal.          Assessment & Plan:

## 2014-11-01 NOTE — Progress Notes (Signed)
Pre visit review using our clinic review tool, if applicable. No additional management support is needed unless otherwise documented below in the visit note. 

## 2014-11-01 NOTE — Assessment & Plan Note (Signed)
She is following with urology for this.  I have asked her to have urologist fax me note after her upcoming apt in October.

## 2014-11-01 NOTE — Patient Instructions (Signed)
Continue low sodium diet, try to add regular exercise such as walking. Goal is 30 minutes 5 days a week.

## 2014-11-01 NOTE — Assessment & Plan Note (Signed)
Resolved

## 2014-11-01 NOTE — Assessment & Plan Note (Signed)
BP looks ok today- off of hctz.

## 2014-11-02 LAB — HIV ANTIBODY (ROUTINE TESTING W REFLEX): HIV 1&2 Ab, 4th Generation: NONREACTIVE

## 2015-01-25 ENCOUNTER — Ambulatory Visit (INDEPENDENT_AMBULATORY_CARE_PROVIDER_SITE_OTHER): Payer: PRIVATE HEALTH INSURANCE | Admitting: Physician Assistant

## 2015-01-25 VITALS — BP 124/78 | HR 80 | Temp 99.0°F | Resp 18 | Ht 65.0 in | Wt 176.0 lb

## 2015-01-25 DIAGNOSIS — J069 Acute upper respiratory infection, unspecified: Secondary | ICD-10-CM | POA: Diagnosis not present

## 2015-01-25 MED ORDER — HYDROCOD POLST-CPM POLST ER 10-8 MG/5ML PO SUER: 5.0000 mL | Freq: Every evening | ORAL | Status: AC | PRN

## 2015-01-25 MED ORDER — AZITHROMYCIN 250 MG PO TABS: ORAL_TABLET | ORAL | Status: DC

## 2015-01-25 NOTE — Progress Notes (Signed)
[facility] [address] [place] [address] [phone]  Date:  01/25/2015   Name:  [name] [name]   DOB:  12/27/1965   MRN:  [id number]  PCP:  Lemont Fillers., NP    History of Present Illness:  [name] [name] is a 49 y.o. female patient who presents to [facility] for 2 weeks of cough, chest congestion, and hoarseness.  She states that it progressively worsened for the last 10 days.  Cough was a productive sputum of yellow, but now has resolved into a dry nagging cough.  She also has a new sore throat for the last 5 days.  She has no nasal congestion at this time, nor ear discomfort.  No current fever.  She believes she is getting better besides the nagging cough, and new onset of a sore throat.  She has gargled, and used dayquil.  Ibuprofen appear to be the only relief for the sore throat.  No GI symptoms.     Patient Active Problem List   Diagnosis Date Noted  . Microscopic hematuria 11/01/2014  . Routine general medical examination at a health care facility 09/06/2011  . Mild HTN 07/26/2011  . CAROTID BRUIT, RIGHT 09/22/2007    Past Medical History  Diagnosis Date  . Hematuria     Past Surgical History  Procedure Laterality Date  . Tubal ligation  1994    Social History  Substance Use Topics  . Smoking status: Never Smoker   . Smokeless tobacco: Never Used  . Alcohol Use: 6.0 oz/week    10 Standard drinks or equivalent per week    Family History  Problem Relation Age of Onset  . Hypertension Mother   . Heart attack Father   . Diabetes Sister   . Hypertension Brother   . Cancer Neg Hx   . Kidney disease Neg Hx   . Stroke Neg Hx   . Hyperlipidemia Sister   . Urolithiasis Brother     No Known Allergies  Medication list has been reviewed and updated.  Current Outpatient Prescriptions on File Prior to Visit  Medication Sig Dispense Refill  . Multiple Vitamin (MULTIVITAMIN) tablet Take 1 tablet by mouth daily.    . hydrochlorothiazide  (HYDRODIURIL) 25 MG tablet Take 1 tablet (25 mg total) by mouth daily. (Patient not taking: Reported on 01/25/2015) 30 tablet 2   No current facility-administered medications on file prior to visit.    ROS ROS otherwise unremarkable unless listed above.   Physical Examination: BP 124/78 mmHg  Pulse 80  Temp(Src) 99 F (37.2 C) (Oral)  Resp 18  Ht  (1.651 m)  Wt 176 lb (79.833 kg)  BMI 29.29 kg/m2  SpO2 98%  LMP 01/18/2015 Ideal Body Weight: Weight in (lb) to have BMI = 25: 149.9  Physical Exam  Constitutional: She is oriented to person, place, and time. She appears well-developed and well-nourished. No distress.  HENT:  Head: Normocephalic and atraumatic.  Right Ear: Tympanic membrane, external ear and ear canal normal.  Left Ear: Tympanic membrane, external ear and ear canal normal.  Nose: No mucosal edema or rhinorrhea. Right sinus exhibits no maxillary sinus tenderness and no frontal sinus tenderness. Left sinus exhibits no maxillary sinus tenderness and no frontal sinus tenderness.  Mouth/Throat: No uvula swelling. Posterior oropharyngeal erythema (mild) present. No oropharyngeal exudate or posterior oropharyngeal edema.    Eyes: Conjunctivae and EOM are normal. Pupils are equal, round, and reactive to light.  Cardiovascular: Normal rate and regular  rhythm.  Exam reveals no gallop, no distant heart sounds and no friction rub.   No murmur heard. Pulmonary/Chest: Effort normal. No respiratory distress. She has no decreased breath sounds. She has no wheezes. She has no rhonchi.  Lymphadenopathy:       Head (right side): No submandibular, no tonsillar, no preauricular and no posterior auricular adenopathy present.       Head (left side): No submandibular, no tonsillar, no preauricular and no posterior auricular adenopathy present.  Neurological: She is alert and oriented to person, place, and time.  Skin: She is not diaphoretic.  Psychiatric: She has a normal mood and  affect. Her behavior is normal.     Assessment and Plan: [name] BreezeMonica Bellissimo is a 49 y.o. female who is here today for sore throat. -treating for bacterial etiology.  Advised mucinex, and tussionex at this time.   Acute upper respiratory infection - Plan: chlorpheniramine-HYDROcodone (TUSSIONEX PENNKINETIC ER) 10-8 MG/5ML SUER, azithromycin (ZITHROMAX) 250 MG tablet  [name] PlattStephanie English, PA-C [facility] 11/30/20168:57 PM    There are no diagnoses linked to this encounter.  [name] PlattStephanie English, PA-C [facility] 01/25/2015 9:43 AM

## 2015-01-25 NOTE — Patient Instructions (Signed)
Please hydrate well with water at least 64 oz. You can use the cepacol lozenges, and/or ibuprofen/tylenol for throat pain Take antibiotic to completion.  Upper Respiratory Infection, Adult Most upper respiratory infections (URIs) are a viral infection of the air passages leading to the lungs. A URI affects the nose, throat, and upper air passages. The most common type of URI is nasopharyngitis and is typically referred to as "the common cold." URIs run their course and usually go away on their own. Most of the time, a URI does not require medical attention, but sometimes a bacterial infection in the upper airways can follow a viral infection. This is called a secondary infection. Sinus and middle ear infections are common types of secondary upper respiratory infections. Bacterial pneumonia can also complicate a URI. A URI can worsen asthma and chronic obstructive pulmonary disease (COPD). Sometimes, these complications can require emergency medical care and may be life threatening.  CAUSES Almost all URIs are caused by viruses. A virus is a type of germ and can spread from one person to another.  RISKS FACTORS You may be at risk for a URI if:   You smoke.   You have chronic heart or lung disease.  You have a weakened defense (immune) system.   You are very young or very old.   You have nasal allergies or asthma.  You work in crowded or poorly ventilated areas.  You work in health care facilities or schools. SIGNS AND SYMPTOMS  Symptoms typically develop 2-3 days after you come in contact with a cold virus. Most viral URIs last 7-10 days. However, viral URIs from the influenza virus (flu virus) can last 14-18 days and are typically more severe. Symptoms may include:   Runny or stuffy (congested) nose.   Sneezing.   Cough.   Sore throat.   Headache.   Fatigue.   Fever.   Loss of appetite.   Pain in your forehead, behind your eyes, and over your cheekbones (sinus  pain).  Muscle aches.  DIAGNOSIS  Your health care provider may diagnose a URI by:  Physical exam.  Tests to check that your symptoms are not due to another condition such as:  Strep throat.  Sinusitis.  Pneumonia.  Asthma. TREATMENT  A URI goes away on its own with time. It cannot be cured with medicines, but medicines may be prescribed or recommended to relieve symptoms. Medicines may help:  Reduce your fever.  Reduce your cough.  Relieve nasal congestion. HOME CARE INSTRUCTIONS   Take medicines only as directed by your health care provider.   Gargle warm saltwater or take cough drops to comfort your throat as directed by your health care provider.  Use a warm mist humidifier or inhale steam from a shower to increase air moisture. This may make it easier to breathe.  Drink enough fluid to keep your urine clear or pale yellow.   Eat soups and other clear broths and maintain good nutrition.   Rest as needed.   Return to work when your temperature has returned to normal or as your health care provider advises. You may need to stay home longer to avoid infecting others. You can also use a face mask and careful hand washing to prevent spread of the virus.  Increase the usage of your inhaler if you have asthma.   Do not use any tobacco products, including cigarettes, chewing tobacco, or electronic cigarettes. If you need help quitting, ask your health care provider. PREVENTION  The  best way to protect yourself from getting a cold is to practice good hygiene.   Avoid oral or hand contact with people with cold symptoms.   Wash your hands often if contact occurs.  There is no clear evidence that vitamin C, vitamin E, echinacea, or exercise reduces the chance of developing a cold. However, it is always recommended to get plenty of rest, exercise, and practice good nutrition.  SEEK MEDICAL CARE IF:   You are getting worse rather than better.   Your symptoms are  not controlled by medicine.   You have chills.  You have worsening shortness of breath.  You have brown or red mucus.  You have yellow or brown nasal discharge.  You have pain in your face, especially when you bend forward.  You have a fever.  You have swollen neck glands.  You have pain while swallowing.  You have white areas in the back of your throat. SEEK IMMEDIATE MEDICAL CARE IF:   You have severe or persistent:  Headache.  Ear pain.  Sinus pain.  Chest pain.  You have chronic lung disease and any of the following:  Wheezing.  Prolonged cough.  Coughing up blood.  A change in your usual mucus.  You have a stiff neck.  You have changes in your:  Vision.  Hearing.  Thinking.  Mood. MAKE SURE YOU:   Understand these instructions.  Will watch your condition.  Will get help right away if you are not doing well or get worse.   This information is not intended to replace advice given to you by your health care provider. Make sure you discuss any questions you have with your health care provider.   Document Released: 08/07/2000 Document Revised: 06/28/2014 Document Reviewed: 05/19/2013 Elsevier Interactive Patient Education [employer].

## 2015-03-03 ENCOUNTER — Telehealth: Payer: Self-pay | Admitting: Family

## 2015-03-03 ENCOUNTER — Ambulatory Visit (INDEPENDENT_AMBULATORY_CARE_PROVIDER_SITE_OTHER): Payer: No Typology Code available for payment source | Admitting: Family

## 2015-03-03 ENCOUNTER — Encounter: Payer: Self-pay | Admitting: Family

## 2015-03-03 VITALS — BP 130/80 | HR 63 | Temp 98.2°F | Resp 16 | Ht 65.0 in | Wt 176.4 lb

## 2015-03-03 DIAGNOSIS — I10 Essential (primary) hypertension: Secondary | ICD-10-CM

## 2015-03-03 DIAGNOSIS — R3129 Other microscopic hematuria: Secondary | ICD-10-CM

## 2015-03-03 NOTE — Telephone Encounter (Signed)
See mychart.  

## 2015-03-03 NOTE — Progress Notes (Signed)
Pre visit review using our clinic review tool, if applicable. No additional management support is needed unless otherwise documented below in the visit note. 

## 2015-03-03 NOTE — Patient Instructions (Signed)
OK to stay off of hctz.  Continue to work on [employer] diet, exercise.

## 2015-03-03 NOTE — Progress Notes (Signed)
   Subjective:    Patient ID: [name] [name], female    DOB: 12-23-1965, 50 y.o.   MRN: [id number]  HPI  [name] [name] is a 50 yr old female who presents today for follow up of her hypertension.  She discontinued HCTZ.  She was able to maintain her weight over the holidays, but hopes to lose some weight. BP Readings from Last 3 Encounters:  03/03/15 130/80  01/25/15 124/78  11/01/14 130/84   Microscopic hematuria- She tell me that she followed back up with urology and their work up was negative- they have asked her to follow up with them in 1 year.   Review of Systems    see HPI  Past Medical History  Diagnosis Date  . Hematuria     Social History   Social History  . Marital Status: Divorced    Spouse Name: N/A  . Number of Children: 2  . Years of Education: N/A   Occupational History  .     Social History Main Topics  . Smoking status: Never Smoker   . Smokeless tobacco: Never Used  . Alcohol Use: 6.0 oz/week    10 Standard drinks or equivalent per week  . Drug Use: No  . Sexual Activity: Not on file   Other Topics Concern  . 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 Tubal ligation  1994    Family History  Problem Relation Age of Onset  . Hypertension Mother   . Heart attack Father   . Diabetes Sister   . Hypertension Brother   . Cancer Neg Hx   . Kidney disease Neg Hx   . Stroke Neg Hx   . Hyperlipidemia Sister   . Urolithiasis Brother     No Known Allergies  Current Outpatient Prescriptions on File Prior to Visit  Medication Sig Dispense Refill  . Multiple Vitamin (MULTIVITAMIN) tablet Take 1 tablet by mouth daily.    . hydrochlorothiazide (HYDRODIURIL) 25 MG tablet Take 1 tablet (25 mg total) by mouth daily. (Patient not taking:  Reported on 01/25/2015) 30 tablet 2   No current facility-administered medications on file prior to visit.    BP 130/80 mmHg  Pulse 63  Temp(Src) 98.2 F (36.8 C) (Oral)  Resp 16  Ht 5\' 5"  (1.651 m)  Wt 176 lb 6.4 oz (80.015 kg)  BMI 29.35 kg/m2  SpO2 98%  LMP 02/16/2015    Objective:   Physical Exam  Constitutional: She is oriented to person, place, and time. She appears well-developed and well-nourished.  HENT:  Head: Normocephalic and atraumatic.  Cardiovascular: Normal rate, regular rhythm and normal heart sounds.   No murmur heard. Pulmonary/Chest: Effort normal and breath sounds normal. No respiratory distress. She has no wheezes.  Musculoskeletal: She exhibits no edema.  Neurological: She is alert and oriented to person, place, and time.  Psychiatric: She has a normal mood and affect. Her behavior is normal. Judgment and thought content normal.          Assessment & Plan:

## 2015-03-03 NOTE — Assessment & Plan Note (Signed)
BP looks good off of HCTZ, continue weight loss efforts, low sodium diet. Follow up in 4 months for BP recheck.

## 2015-03-03 NOTE — Assessment & Plan Note (Signed)
Work up negative per pt.  Will request records from urology.

## 2015-03-17 ENCOUNTER — Telehealth: Payer: Self-pay | Admitting: Family

## 2015-03-17 NOTE — Telephone Encounter (Signed)
Please contact pt re: unread mychart message. 

## 2015-03-17 NOTE — Telephone Encounter (Signed)
Opened in error

## 2015-03-17 NOTE — Telephone Encounter (Signed)
Spoke with pt. She saw Dr Isabel Caprice with [facility].  She will come by the office on Monday to sign records release. Release has been placed at the front desk.

## 2015-06-30 ENCOUNTER — Ambulatory Visit (INDEPENDENT_AMBULATORY_CARE_PROVIDER_SITE_OTHER): Payer: No Typology Code available for payment source | Admitting: Family

## 2015-06-30 ENCOUNTER — Encounter: Payer: Self-pay | Admitting: Family

## 2015-06-30 VITALS — BP 126/75 | HR 73 | Temp 99.2°F | Resp 16 | Ht 65.0 in | Wt 173.6 lb

## 2015-06-30 DIAGNOSIS — B9789 Other viral agents as the cause of diseases classified elsewhere: Principal | ICD-10-CM

## 2015-06-30 DIAGNOSIS — J069 Acute upper respiratory infection, unspecified: Secondary | ICD-10-CM

## 2015-06-30 DIAGNOSIS — I10 Essential (primary) hypertension: Secondary | ICD-10-CM | POA: Diagnosis not present

## 2015-06-30 MED ORDER — BENZONATATE 100 MG PO CAPS: 100.0000 mg | ORAL_CAPSULE | Freq: Three times a day (TID) | ORAL | Status: DC | PRN

## 2015-06-30 NOTE — Assessment & Plan Note (Signed)
BP remains stable off of medications. Continue low sodium diet. Discussed adding exercise.

## 2015-06-30 NOTE — Patient Instructions (Signed)
You may use tessalon as needed for cough and tylenol as needed for chest discomfort. Please call if new/worsening symptoms, fever or if symptoms are not improved in 3-4 days.

## 2015-06-30 NOTE — Progress Notes (Signed)
Pre visit review using our clinic review tool, if applicable. No additional management support is needed unless otherwise documented below in the visit note. 

## 2015-06-30 NOTE — Progress Notes (Signed)
Subjective:    Patient ID: [name] [name], female    DOB: 1965/08/10, 50 y.o.   MRN: [id number]  HPI  [name] [name] is a 50 yr old female who presents today for follow up.  1) HTN- She is currently off of blood pressure medications. She reports that she is adherent to a low sodium diet. She is not exercising.  BP Readings from Last 3 Encounters:  06/30/15 126/75  03/03/15 130/80  01/25/15 124/78   2) cough- reports intermittent cough for 4 days.   Reports "cold" in her chest.  Chest hurts when she breaths.  Cough is dry. Denies fever. Using dayquil with some improvement. Denies wheezing. Denies nasal congestion.  Review of Systems  See HPI  Past Medical History  Diagnosis Date  . Hematuria      Social History   Social History  . Marital Status: Divorced    Spouse Name: N/A  . Number of Children: 2  . Years of Education: N/A   Occupational History  .     Social History Main Topics  . Smoking status: Never Smoker   . Smokeless tobacco: Never Used  . Alcohol Use: 6.0 oz/week    10 Standard drinks or equivalent per week  . Drug Use: No  . Sexual Activity: Not on file   Other Topics Concern  . 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 Tubal ligation  1994    Family History  Problem Relation Age of Onset  . Hypertension Mother   . Heart attack Father   . Diabetes Sister   . Hypertension Brother   . Cancer Neg Hx   . Kidney disease Neg Hx   . Stroke Neg Hx   . Hyperlipidemia Sister   . Urolithiasis Brother     No Known Allergies  Current Outpatient Prescriptions on File Prior to Visit  Medication Sig Dispense Refill  . Multiple Vitamin (MULTIVITAMIN) tablet Take 1 tablet by mouth daily.     No current facility-administered medications on  file prior to visit.    BP 126/75 mmHg  Pulse 73  Temp(Src) 99.2 F (37.3 C) (Oral)  Resp 16  Ht  (1.651 m)  Wt 173 lb 9.6 oz (78.744 kg)  BMI 28.89 kg/m2  SpO2 97%  LMP 06/16/2015        Objective:   Physical Exam  Constitutional: She is oriented to person, place, and time. She appears well-developed and well-nourished.  HENT:  Right Ear: Tympanic membrane and ear canal normal.  Left Ear: Tympanic membrane and ear canal normal.  Mouth/Throat: No oropharyngeal exudate, posterior oropharyngeal edema or posterior oropharyngeal erythema.  Cardiovascular: Normal rate, regular rhythm and normal heart sounds.   No murmur heard. Pulmonary/Chest: Effort normal and breath sounds normal. No respiratory distress. She has no wheezes.  Lymphadenopathy:    She has no cervical adenopathy.  Neurological: She is alert and oriented to person, place, and time.  Skin: Skin is warm and dry.  Psychiatric: She has a normal mood and affect. Her behavior is normal. Judgment and thought content normal.          Assessment & Plan:  Viral URI  with cough-  rx provided for tessalon prn.  Pt advised as follows:  You may use tessalon as needed for cough and tylenol as needed for chest discomfort. Please call if new/worsening symptoms, fever or if symptoms are not improved in 3-4 days.

## 2015-10-27 LAB — HM MAMMOGRAPHY: HM MAMMO: NORMAL (ref 0–4)

## 2015-10-27 LAB — HM PAP SMEAR: HM Pap smear: NORMAL

## 2016-08-29 ENCOUNTER — Encounter: Payer: Self-pay | Admitting: Family

## 2016-08-29 ENCOUNTER — Ambulatory Visit (INDEPENDENT_AMBULATORY_CARE_PROVIDER_SITE_OTHER): Payer: No Typology Code available for payment source | Admitting: Family

## 2016-08-29 VITALS — BP 152/100 | HR 64 | Temp 98.4°F | Resp 16 | Ht 64.0 in | Wt 185.4 lb

## 2016-08-29 DIAGNOSIS — Z0000 Encounter for general adult medical examination without abnormal findings: Secondary | ICD-10-CM | POA: Diagnosis not present

## 2016-08-29 DIAGNOSIS — I10 Essential (primary) hypertension: Secondary | ICD-10-CM | POA: Diagnosis not present

## 2016-08-29 LAB — BASIC METABOLIC PANEL
BUN: 17 mg/dL (ref 6–23)
CALCIUM: 9.2 mg/dL (ref 8.4–10.5)
CO2: 28 mEq/L (ref 19–32)
CREATININE: 0.83 mg/dL (ref 0.40–1.20)
Chloride: 102 mEq/L (ref 96–112)
GFR: 93.79 mL/min (ref >60.00)
Glucose, Bld: 110 mg/dL — ABNORMAL HIGH (ref 70–99)
Potassium: 4 mEq/L (ref 3.5–5.1)
SODIUM: 136 meq/L (ref 135–145)

## 2016-08-29 LAB — CBC WITH DIFFERENTIAL/PLATELET
BASOS ABS: 0 10*3/uL (ref 0.0–0.1)
Basophils Relative: 0.2 % (ref 0.0–3.0)
EOS ABS: 0.2 10*3/uL (ref 0.0–0.7)
Eosinophils Relative: 2.1 % (ref 0.0–5.0)
HEMATOCRIT: 35.6 % — AB (ref 36.0–46.0)
HEMOGLOBIN: 11.6 g/dL — AB (ref 12.0–15.0)
LYMPHS PCT: 28.9 % (ref 12.0–46.0)
Lymphs Abs: 2.9 10*3/uL (ref 0.7–4.0)
MCHC: 32.5 g/dL (ref 30.0–36.0)
MCV: 88.7 fl (ref 78.0–100.0)
Monocytes Absolute: 1.1 10*3/uL — ABNORMAL HIGH (ref 0.1–1.0)
Monocytes Relative: 11.5 % (ref 3.0–12.0)
Neutro Abs: 5.7 10*3/uL (ref 1.4–7.7)
Neutrophils Relative %: 57.3 % (ref 43.0–77.0)
PLATELETS: 337 10*3/uL (ref 150.0–400.0)
RBC: 4.01 Mil/uL (ref 3.87–5.11)
RDW: 12.9 % (ref 11.5–15.5)
WBC: 9.9 10*3/uL (ref 4.0–10.5)

## 2016-08-29 LAB — LIPID PANEL
CHOL/HDL RATIO: 3
Cholesterol: 186 mg/dL (ref 0–200)
HDL: 65.1 mg/dL (ref >39.00)
LDL Cholesterol: 92 mg/dL (ref 0–99)
NONHDL: 120.47
Triglycerides: 140 mg/dL (ref 0.0–149.0)
VLDL: 28 mg/dL (ref 0.0–40.0)

## 2016-08-29 LAB — HEPATIC FUNCTION PANEL
ALK PHOS: 41 U/L (ref 39–117)
ALT: 17 U/L (ref 0–35)
AST: 17 U/L (ref 0–37)
Albumin: 3.9 g/dL (ref 3.5–5.2)
BILIRUBIN DIRECT: 0 mg/dL (ref 0.0–0.3)
TOTAL PROTEIN: 6.7 g/dL (ref 6.0–8.3)
Total Bilirubin: 0.2 mg/dL (ref 0.2–1.2)

## 2016-08-29 LAB — TSH: TSH: 0.71 u[IU]/mL (ref 0.35–4.50)

## 2016-08-29 MED ORDER — AMLODIPINE BESYLATE 5 MG PO TABS: 5.0000 mg | ORAL_TABLET | Freq: Every day | ORAL | 3 refills | Status: DC

## 2016-08-29 NOTE — Patient Instructions (Signed)
Complete lab work prior to leaving. Please work on adding regular exercise and healthy diet. Try to keep your calories to 1500 a day to help promote weight loss. He continues an apple such as my fitness spelled help you count calories and keep track. Exercise. Try to limit your alcohol consumption to no more than 1 per day. Avoid adding extra salt to her food and try to avoid high sodium foods. Add amlodipine 5 mg by mouth once daily for blood pressure.

## 2016-08-29 NOTE — Progress Notes (Signed)
Subjective:    Patient ID: [name] [name], female    DOB: [birth year], 51 y.o.   MRN: [id number]  HPI  [name] [name] today for complete physical.  Immunizations: tetanus up to date Diet: eating out too much Wt Readings from Last 3 Encounters:  08/29/16 185 lb 6.4 oz (84.1 kg)  06/30/15 173 lb 9.6 oz (78.7 kg)  03/03/15 176 lb 6.4 oz (80 kg)  Exercise: reports that she walks in her neighborhood, but not often enough.   Colonoscopy: due in January Pap Smear: 9/17- normal per patient Mammogram: 9/17- normal per patient Vision: had exam 3/18 Dental: up to date  BP Readings from Last 3 Encounters:  08/29/16 140/66  06/30/15 126/75  03/03/15 130/80   Alcohol use-she reports drinking 2 glasses of wine, and some beer mixed with hard alcohol daily.  Review of Systems  Constitutional: Positive for unexpected weight change.  HENT: Negative for hearing loss and rhinorrhea.   Eyes: Negative for visual disturbance.  Respiratory: Negative for cough.   Cardiovascular:       Occasional ankle swelling  Gastrointestinal: Negative for constipation and diarrhea.  Genitourinary: Negative for dysuria, frequency and menstrual problem.  Musculoskeletal: Negative for arthralgias and myalgias.  Neurological:       Had a headache on friday  Hematological: Negative for adenopathy.  Psychiatric/Behavioral:       Denies depression/anxiety       Past Medical History:  Diagnosis Date  . Hematuria      Social History   Social History  . Marital status: Divorced    Spouse name: N/A  . Number of children: 2  . Years of education: N/A   Occupational History  .  [employer]    Social History Main Topics  . Smoking status: Never Smoker  . Smokeless tobacco: Never Used  . Alcohol use 6.0 oz/week    10 Standard drinks or equivalent per week  . Drug use: No  . Sexual activity: Not on file   Other Topics Concern  . 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 TUBAL LIGATION  1994    Family History  Problem Relation Age of Onset  . Hypertension Mother   . Heart attack Father   . Diabetes Sister   . Hypertension Brother   . Hyperlipidemia Sister   . Urolithiasis Brother   . Cancer Neg Hx   . Kidney disease Neg Hx   . Stroke Neg Hx     No Known Allergies  Current Outpatient Prescriptions on File Prior to Visit  Medication Sig Dispense Refill  . Multiple Vitamin (MULTIVITAMIN) tablet Take 1 tablet by mouth daily.     No current facility-administered medications on file prior to visit.     BP 140/66 (BP Location: Right Arm, Cuff Size: Normal)   Pulse 64   Temp 98.4 F (36.9 C) (Oral)   Resp 16   Ht 5\' 4"  (1.626 m)   Wt 185 lb 6.4 oz (84.1 kg)   LMP 08/23/2016   SpO2 99%   BMI 31.82 kg/m    Objective:   Physical Exam  Physical Exam  Constitutional: She is oriented to  person, place, and time. She appears well-developed and well-nourished. No distress.  HENT:  Head: Normocephalic and atraumatic.  Right Ear: Tympanic membrane and ear canal normal.  Left Ear: Tympanic membrane and ear canal normal.  Mouth/Throat: Oropharynx is clear and moist.  Eyes: Pupils are equal, round, and reactive to light. No scleral icterus.  Neck: Normal range of motion. No thyromegaly present.  Cardiovascular: Normal rate and regular rhythm.   No murmur heard. Pulmonary/Chest: Effort normal and breath sounds normal. No respiratory distress. He has no wheezes. She has no rales. She exhibits no tenderness.  Abdominal: Soft. Bowel sounds are normal. She exhibits no distension and no mass. There is no tenderness. There is no rebound and no guarding.  Musculoskeletal: She exhibits no edema.  Lymphadenopathy:    She has no cervical adenopathy.  Neurological:  She is alert and oriented to person, place, and time. She has normal patellar reflexes. She exhibits normal muscle tone. Coordination normal.  Skin: Skin is warm and dry.  Psychiatric: She has a normal mood and affect. Her behavior is normal. Judgment and thought content normal.  Breasts: Examined lying Right: Without masses, retractions, discharge or axillary adenopathy.  Left: Without masses, retractions, discharge or axillary adenopathy.        Assessment & Plan:         Assessment & Plan:  Preventative Care- EKG tracing is personally reviewed.  EKG notes NSR.  No acute changes. We discussed cutting back on her alcohol consumption with the goal of one or less drinks per day. we discussed counting calories as well as weight loss with a goal weight loss of 1 pound per week. Obtain routine lab work. Mammogram, Pap, and immunizations are up-to-date.  Hypertension-new. Will initiate amlodipine 5 mg by mouth once daily. Plan follow-up in one month for blood pressure recheck.

## 2016-08-29 NOTE — Progress Notes (Signed)
   Subjective:    Patient ID: [name] [name], female    DOB: 1966/02/22, 51 y.o.   MRN: [id number]  HPI    Review of Systems     Objective:   Physical Exam        Assessment & Plan:

## 2016-08-30 LAB — URINALYSIS, ROUTINE W REFLEX MICROSCOPIC
Bilirubin Urine: NEGATIVE
KETONES UR: NEGATIVE
Leukocytes, UA: NEGATIVE
Nitrite: NEGATIVE
PH: 6.5 (ref 5.0–8.0)
SPECIFIC GRAVITY, URINE: 1.015 (ref 1.000–1.030)
Total Protein, Urine: NEGATIVE
UROBILINOGEN UA: 0.2 (ref 0.0–1.0)
Urine Glucose: NEGATIVE

## 2016-09-01 ENCOUNTER — Telehealth: Payer: Self-pay | Admitting: Family

## 2016-09-01 DIAGNOSIS — D649 Anemia, unspecified: Secondary | ICD-10-CM

## 2016-09-01 DIAGNOSIS — R3129 Other microscopic hematuria: Secondary | ICD-10-CM

## 2016-09-01 NOTE — Telephone Encounter (Signed)
Labs show mild anemia.  Also, urine should some microscopic blood.  Mild anemia.  Please ask pt to repeat UA with micro in 2 weeks, dx microscopic hematuria.  She is also mildly anemic. I would like her to serum iron, b12 and folate at that time as well as complete an ifob, dx anemia.  Sugar was mildly elevated but not in the diabetic range.

## 2016-09-03 NOTE — Telephone Encounter (Signed)
Attempted to reach pt by phone and received voicemail. Sent mychart message to pt. Placed future orders.

## 2016-09-04 NOTE — Telephone Encounter (Signed)
IFOB kit labeled and placed in lab for appt on 09/17/16.

## 2016-09-17 ENCOUNTER — Other Ambulatory Visit (INDEPENDENT_AMBULATORY_CARE_PROVIDER_SITE_OTHER): Payer: No Typology Code available for payment source

## 2016-09-17 DIAGNOSIS — D649 Anemia, unspecified: Secondary | ICD-10-CM

## 2016-09-17 DIAGNOSIS — R3129 Other microscopic hematuria: Secondary | ICD-10-CM | POA: Diagnosis not present

## 2016-09-17 LAB — IRON: IRON: 68 ug/dL (ref 42–145)

## 2016-09-17 LAB — URINALYSIS, ROUTINE W REFLEX MICROSCOPIC
Bilirubin Urine: NEGATIVE
Ketones, ur: NEGATIVE
Leukocytes, UA: NEGATIVE
Nitrite: NEGATIVE
TOTAL PROTEIN, URINE-UPE24: NEGATIVE
URINE GLUCOSE: NEGATIVE
UROBILINOGEN UA: 0.2 (ref 0.0–1.0)
pH: 5.5 (ref 5.0–8.0)

## 2016-09-17 LAB — FOLATE: Folate: 14.4 ng/mL (ref >5.9)

## 2016-09-17 LAB — VITAMIN B12: VITAMIN B 12: 417 pg/mL (ref 211–911)

## 2016-09-18 ENCOUNTER — Telehealth: Payer: Self-pay | Admitting: Family

## 2016-09-18 NOTE — Telephone Encounter (Signed)
Iron, B12, and folate levels are normal. She does still have microscopic blood in her urine. Please confirm that she did not have her period that day. If she did not have her period then I would like to refer her for 8 to urology for diagnosis microscopic hematuria, please request [facility].

## 2016-09-19 NOTE — Telephone Encounter (Signed)
Tried to contact to give lab results. No answer, left message for pt to call back.

## 2016-09-20 ENCOUNTER — Other Ambulatory Visit: Payer: Self-pay | Admitting: Emergency Medicine

## 2016-09-20 DIAGNOSIS — R3129 Other microscopic hematuria: Secondary | ICD-10-CM

## 2016-09-20 NOTE — Telephone Encounter (Signed)
Lab results given to pt. Pt states that she was not on her cycle. Informed pt that provider would like to put in a referral to [facility]. Advised pt that [facility] would contact her to set up an appt. Pt verbalized understanding.

## 2016-09-20 NOTE — Telephone Encounter (Signed)
Patient returning call.

## 2016-10-11 ENCOUNTER — Encounter: Payer: Self-pay | Admitting: Family

## 2016-10-11 ENCOUNTER — Ambulatory Visit (INDEPENDENT_AMBULATORY_CARE_PROVIDER_SITE_OTHER): Payer: No Typology Code available for payment source | Admitting: Family

## 2016-10-11 VITALS — BP 118/70 | HR 68 | Temp 98.5°F | Resp 16 | Ht 64.0 in | Wt 180.2 lb

## 2016-10-11 DIAGNOSIS — I10 Essential (primary) hypertension: Secondary | ICD-10-CM | POA: Diagnosis not present

## 2016-10-11 MED ORDER — AMLODIPINE BESYLATE 5 MG PO TABS: 5.0000 mg | ORAL_TABLET | Freq: Every day | ORAL | 1 refills | Status: DC

## 2016-10-11 NOTE — Patient Instructions (Signed)
Please complete lab work prior to leaving.   

## 2016-10-11 NOTE — Progress Notes (Signed)
   Subjective:    Patient ID: [name] [name], female    DOB: 12-09-1965, 51 y.o.   MRN: [id number]  HPI  [name] is a 51 yr old female who presents today for follow up.  HTN- last visit blood pressure was noted to be elevated. She was placed on amlodipine 5mg .  Denies CP/SOB or swelling BP Readings from Last 3 Encounters:  10/11/16 118/70  08/29/16 (!) 152/100  06/30/15 126/75    Wt Readings from Last 3 Encounters:  10/11/16 180 lb 3.2 oz (81.7 kg)  08/29/16 185 lb 6.4 oz (84.1 kg)  06/30/15 173 lb 9.6 oz (78.7 kg)    Review of Systems See HPI  Past Medical History:  Diagnosis Date  . Hematuria      Social History   Social History  . Marital status: Divorced    Spouse name: N/A  . Number of children: 2  . Years of education: N/A   Occupational History  .  [employer]    Social History Main Topics  . Smoking status: Never Smoker  . Smokeless tobacco: Never Used  . Alcohol use 6.0 oz/week    10 Standard drinks or equivalent per week     Comment: 2-3 glass wine/day,   . Drug use: No  . Sexual activity: Not on file   Other Topics Concern  . 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 TUBAL LIGATION  1994    Family History  Problem Relation Age of Onset  . Hypertension Mother   . Heart attack Father   . Diabetes Sister   . Hypertension Brother   . Hyperlipidemia Sister   . Urolithiasis Brother   . Cancer Neg Hx   . Kidney disease Neg Hx   . Stroke Neg Hx     No Known Allergies  Current Outpatient Prescriptions on File Prior to Visit  Medication Sig Dispense Refill  . amLODipine (NORVASC) 5 MG tablet Take 1 tablet (5 mg total) by mouth daily. 30 tablet 3  . Multiple Vitamin (MULTIVITAMIN) tablet Take 1 tablet by mouth daily.      No current facility-administered medications on file prior to visit.     BP 118/70 (BP Location: Left Arm, Cuff Size: Normal)   Pulse 68   Temp 98.5 F (36.9 C) (Oral)   Resp 16   Ht 5\' 4"  (1.626 m)   Wt 180 lb 3.2 oz (81.7 kg)   LMP 10/09/2016   SpO2 98%   BMI 30.93 kg/m       Objective:   Physical Exam  Constitutional: She appears well-developed and well-nourished.  Cardiovascular: Normal rate, regular rhythm and normal heart sounds.   No murmur heard. Pulmonary/Chest: Effort normal and breath sounds normal. No respiratory distress. She has no wheezes.  Psychiatric: She has a normal mood and affect. Her behavior is normal. Judgment and thought content normal.          Assessment & Plan:  HTN- BP at goal, tolerating amlodipine. Continue same, follow up in 6 months.

## 2016-12-24 ENCOUNTER — Other Ambulatory Visit: Payer: Self-pay | Admitting: Family

## 2017-04-18 ENCOUNTER — Encounter: Payer: Self-pay | Admitting: Family

## 2017-04-18 ENCOUNTER — Ambulatory Visit: Payer: No Typology Code available for payment source | Admitting: Family

## 2017-04-18 VITALS — BP 123/52 | HR 69 | Temp 98.4°F | Resp 16 | Ht 64.0 in | Wt 183.0 lb

## 2017-04-18 DIAGNOSIS — Z0000 Encounter for general adult medical examination without abnormal findings: Secondary | ICD-10-CM

## 2017-04-18 DIAGNOSIS — Z23 Encounter for immunization: Secondary | ICD-10-CM | POA: Diagnosis not present

## 2017-04-18 MED ORDER — AMLODIPINE BESYLATE 5 MG PO TABS: 5.0000 mg | ORAL_TABLET | Freq: Every day | ORAL | 1 refills | Status: DC

## 2017-04-18 NOTE — Progress Notes (Signed)
   Subjective:    Patient ID: Coolidge BreezeMonica Giovanelli, female    DOB: May 20, 1965, 52 y.o.   MRN: [id number]  HPI  [name] is a 52 yr old female who presents today for follow up.  1) HTN- patient is maintained on amlodipine 5mg . No problems with meds.     BP Readings from Last 3 Encounters:  04/18/17 (!) 123/52  10/11/16 118/70  08/29/16 (!) 152/100     Review of Systems See HPI  Past Medical History:  Diagnosis Date  . Hematuria      Social History   Socioeconomic History  . Marital status: Divorced    Spouse name: Not on file  . Number of children: 2  . Years of education: Not on file  . Highest education level: Not on file  Social Needs  . Financial resource strain: Not on file  . Food insecurity - worry: Not on file  . Food insecurity - inability: Not on file  . Transportation needs - medical: Not on file  . Transportation needs - non-medical: Not on file  Occupational History    Employer: [employer]   Tobacco Use  . Smoking status: Never Smoker  . Smokeless tobacco: Never Used  Substance and Sexual Activity  . Alcohol use: Yes    Alcohol/week: 6.0 oz    Types: 10 Standard drinks or equivalent per week    Comment: 2-3 glass wine/day,   . Drug use: No  . Sexual activity: Not on file  Other Topics Concern  . 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 TUBAL LIGATION  1994    Family History  Problem Relation Age of Onset  . Hypertension Mother   . Heart attack Father   . Diabetes Sister   . Hypertension Brother   . Hyperlipidemia Sister   . Urolithiasis Brother   . Cancer Neg Hx   . Kidney disease Neg Hx   . Stroke Neg Hx     No Known Allergies  Current Outpatient Medications on File Prior to Visit  Medication Sig Dispense Refill   . Multiple Vitamin (MULTIVITAMIN) tablet Take 1 tablet by mouth daily.     No current facility-administered medications on file prior to visit.     BP (!) 123/52 (BP Location: Right Arm, Patient Position: Sitting, Cuff Size: Large)   Pulse 69   Temp 98.4 F (36.9 C) (Oral)   Resp 16   Ht 5\' 4"  (1.626 m)   Wt 183 lb (83 kg)   SpO2 100%   BMI 31.41 kg/m       Objective:   Physical Exam  Constitutional: She appears well-developed and well-nourished.  Cardiovascular: Normal rate, regular rhythm and normal heart sounds.  No murmur heard. Pulmonary/Chest: Effort normal and breath sounds normal. No respiratory distress. She has no wheezes.  Psychiatric: She has a normal mood and affect. Her behavior is normal. Judgment and thought content normal.          Assessment & Plan:  HTN- BP is stable on current medication. Continue same. Flu shot today.

## 2017-04-18 NOTE — Patient Instructions (Signed)
Keep up the good work

## 2017-05-05 ENCOUNTER — Encounter: Payer: Self-pay | Admitting: Gastroenterology

## 2017-05-30 ENCOUNTER — Other Ambulatory Visit: Payer: Self-pay

## 2017-05-30 ENCOUNTER — Ambulatory Visit (AMBULATORY_SURGERY_CENTER): Payer: Self-pay

## 2017-05-30 VITALS — Ht 64.0 in | Wt 180.8 lb

## 2017-05-30 DIAGNOSIS — Z1211 Encounter for screening for malignant neoplasm of colon: Secondary | ICD-10-CM

## 2017-05-30 MED ORDER — PEG-KCL-NACL-NASULF-NA ASC-C 140 G PO SOLR: 1.0000 | Freq: Once | ORAL | 0 refills | Status: AC

## 2017-05-30 NOTE — Progress Notes (Signed)
No egg or soy allergy known to patient  No issues with past sedation with any surgeries  or procedures, no intubation problems  No diet pills per patient No home 02 use per patient  No blood thinners per patient  Pt denies issues with constipation  No A fib or A flutter  EMMI video sent to pt's e mail  

## 2017-06-27 ENCOUNTER — Encounter: Payer: Self-pay | Admitting: Gastroenterology

## 2017-06-28 ENCOUNTER — Other Ambulatory Visit: Payer: Self-pay | Admitting: Family

## 2017-07-04 ENCOUNTER — Encounter: Payer: No Typology Code available for payment source | Admitting: Gastroenterology

## 2017-07-07 ENCOUNTER — Telehealth: Payer: Self-pay | Admitting: Gastroenterology

## 2017-07-07 NOTE — Telephone Encounter (Signed)
Spoke with patient. Patient did not receive Plenvu universal co-pay card in [place]. This was faxed to [facility] at this time and voice mail left for the pharmacist to go by the instructions so Plenvu should cost $50, pt is aware of this.

## 2017-07-09 ENCOUNTER — Other Ambulatory Visit: Payer: Self-pay

## 2017-07-09 ENCOUNTER — Ambulatory Visit (AMBULATORY_SURGERY_CENTER): Payer: No Typology Code available for payment source | Admitting: Gastroenterology

## 2017-07-09 ENCOUNTER — Encounter: Payer: Self-pay | Admitting: Gastroenterology

## 2017-07-09 VITALS — BP 125/62 | HR 55 | Temp 97.1°F | Resp 11 | Ht 64.0 in | Wt 180.0 lb

## 2017-07-09 DIAGNOSIS — Z1212 Encounter for screening for malignant neoplasm of rectum: Secondary | ICD-10-CM

## 2017-07-09 DIAGNOSIS — Z1211 Encounter for screening for malignant neoplasm of colon: Secondary | ICD-10-CM | POA: Diagnosis not present

## 2017-07-09 MED ORDER — SODIUM CHLORIDE 0.9 % IV SOLN: 500.0000 mL | Freq: Once | INTRAVENOUS | Status: AC

## 2017-07-09 NOTE — Patient Instructions (Signed)

## 2017-07-09 NOTE — Progress Notes (Signed)
To PACU, VSS. Report to [email or web address] 

## 2017-07-09 NOTE — Op Note (Signed)
[facility] Patient Name: [name] [name] Procedure Date: 07/09/2017 9:09 AM MRN: [id number] Endoscopist: Sherilyn Cooter L. Myrtie Neither , MD Age: 52 Referring MD:  Date of Birth: 04/26/1965 Gender: Female Account #: [id number] Procedure:                Colonoscopy Indications:              Screening for colorectal malignant neoplasm, This                            is the patient's first colonoscopy Medicines:                Monitored Anesthesia Care Procedure:                Pre-Anesthesia Assessment:                           - Prior to the procedure, a History and Physical                            was performed, and patient medications and                            allergies were reviewed. The patient's tolerance of                            previous anesthesia was also reviewed. The risks                            and benefits of the procedure and the sedation                            options and risks were discussed with the patient.                            All questions were answered, and informed consent                            was obtained. Prior Anticoagulants: The patient has                            taken no previous anticoagulant or antiplatelet                            agents. ASA Grade Assessment: II - A patient with                            mild systemic disease. After reviewing the risks                            and benefits, the patient was deemed in                            satisfactory condition to undergo the procedure.  After obtaining informed consent, the colonoscope                            was passed under direct vision. Throughout the                            procedure, the patient's blood pressure, pulse, and                            oxygen saturations were monitored continuously. The                            Colonoscope was introduced through the anus and                            advanced to the the cecum,  identified by                            appendiceal orifice and ileocecal valve. The                            colonoscopy was performed without difficulty. The                            patient tolerated the procedure well. The quality                            of the bowel preparation was excellent. The                            ileocecal valve, appendiceal orifice, and rectum                            were photographed. The quality of the bowel                            preparation was evaluated using the BBPS [facility]                            Bowel Preparation Scale) with scores of: Right                            Colon = 3, Transverse Colon = 3 and Left Colon = 3                            (entire mucosa seen well with no residual staining,                            small fragments of stool or opaque liquid). The                            total BBPS score equals 9. The bowel preparation  used was Plenvu. Scope In: 9:20:17 AM Scope Out: 9:31:32 AM Scope Withdrawal Time: 0 hours 8 minutes 3 seconds  Total Procedure Duration: 0 hours 11 minutes 15 seconds  Findings:                 The perianal and digital rectal examinations were                            normal.                           A single small-mouthed diverticulum was found in                            the proximal ascending colon.                           The exam was otherwise without abnormality on                            direct and retroflexion views. Complications:            No immediate complications. Estimated Blood Loss:     Estimated blood loss: none. Impression:               - Diverticulosis in the proximal ascending colon.                           - The examination was otherwise normal on direct                            and retroflexion views.                           - No specimens collected. Recommendation:           - Patient has a contact number available for                             emergencies. The signs and symptoms of potential                            delayed complications were discussed with the                            patient. Return to normal activities tomorrow.                            Written discharge instructions were provided to the                            patient.                           - Resume previous diet.                           - Continue present medications.                           -  Repeat colonoscopy in 10 years for screening                            purposes. Zaila Crew L. Myrtie Neither, MD 07/09/2017 9:35:46 AM This report has been signed electronically.

## 2017-07-10 ENCOUNTER — Telehealth: Payer: Self-pay | Admitting: *Deleted

## 2017-07-10 NOTE — Telephone Encounter (Signed)
  Follow up Call-  Call back number 07/09/2017  Post procedure Call Back phone  # [phone]  Permission to leave phone message Yes  Some recent data might be hidden     Patient questions:  Do you have a fever, pain , or abdominal swelling? No. Pain Score  0 *  Have you tolerated food without any problems? Yes.    Have you been able to return to your normal activities? Yes.    Do you have any questions about your discharge instructions: Diet   No. Medications  No. Follow up visit  No.  Do you have questions or concerns about your Care? No.  Actions: * If pain score is 4 or above: No action needed, pain <4.

## 2017-10-17 ENCOUNTER — Ambulatory Visit: Payer: No Typology Code available for payment source | Admitting: Family

## 2018-02-17 ENCOUNTER — Telehealth: Payer: Self-pay | Admitting: Family

## 2018-02-17 NOTE — Telephone Encounter (Signed)
30 day supply of amlodipine sent to pharmacy. Please call pt to schedule OV with Melissa before further refills are needed. Thanks!

## 2018-02-23 NOTE — Telephone Encounter (Signed)
Called pt and LVM informing pt that she would need an OV before any further refills can be sent. Advised pt to called and schedule appt at her convenience.

## 2018-10-20 ENCOUNTER — Other Ambulatory Visit (HOSPITAL_COMMUNITY): Payer: Self-pay | Admitting: Internal Medicine

## 2018-10-20 ENCOUNTER — Other Ambulatory Visit: Payer: Self-pay

## 2018-10-20 ENCOUNTER — Ambulatory Visit (HOSPITAL_COMMUNITY)
Admission: RE | Admit: 2018-10-20 | Discharge: 2018-10-20 | Disposition: A | Discharge status: Home / Self Care | Payer: No Typology Code available for payment source | Source: Ambulatory Visit | Attending: Family | Admitting: Family

## 2018-10-20 DIAGNOSIS — M79604 Pain in right leg: Secondary | ICD-10-CM | POA: Diagnosis not present

## 2020-02-01 ENCOUNTER — Other Ambulatory Visit: Payer: Self-pay | Admitting: Internal Medicine

## 2020-02-01 DIAGNOSIS — E785 Hyperlipidemia, unspecified: Secondary | ICD-10-CM

## 2020-02-01 DIAGNOSIS — Z0000 Encounter for general adult medical examination without abnormal findings: Secondary | ICD-10-CM

## 2020-02-01 DIAGNOSIS — R7303 Prediabetes: Secondary | ICD-10-CM

## 2020-02-22 ENCOUNTER — Ambulatory Visit
Admission: RE | Admit: 2020-02-22 | Discharge: 2020-02-22 | Disposition: A | Discharge status: Home / Self Care | Payer: Self-pay | Source: Ambulatory Visit | Attending: Internal Medicine | Admitting: Internal Medicine

## 2020-02-22 DIAGNOSIS — Z0000 Encounter for general adult medical examination without abnormal findings: Secondary | ICD-10-CM

## 2020-02-22 DIAGNOSIS — E785 Hyperlipidemia, unspecified: Secondary | ICD-10-CM

## 2020-02-22 DIAGNOSIS — R7303 Prediabetes: Secondary | ICD-10-CM

## 2023-03-14 ENCOUNTER — Ambulatory Visit
Admission: EM | Admit: 2023-03-14 | Discharge: 2023-03-14 | Disposition: A | Discharge status: Home / Self Care | Payer: No Typology Code available for payment source

## 2023-03-14 ENCOUNTER — Encounter: Payer: Self-pay | Admitting: Emergency Medicine

## 2023-03-14 DIAGNOSIS — J069 Acute upper respiratory infection, unspecified: Secondary | ICD-10-CM

## 2023-03-14 MED ORDER — GUAIFENESIN ER 1200 MG PO TB12: 1200.0000 mg | ORAL_TABLET | Freq: Two times a day (BID) | ORAL | 0 refills | Status: DC

## 2023-03-14 MED ORDER — PROMETHAZINE-DM 6.25-15 MG/5ML PO SYRP: 5.0000 mL | ORAL_SOLUTION | Freq: Every evening | ORAL | 0 refills | Status: DC | PRN

## 2023-03-14 NOTE — ED Triage Notes (Signed)
Pt c/o cough and congestion since January 1st.

## 2023-03-14 NOTE — ED Provider Notes (Addendum)
[facility]    CSN: [id number] Arrival date & time: 03/14/23  1002      History   Chief Complaint Chief Complaint  Patient presents with   Cough   Nasal Congestion    HPI [name] [name] is a 58 y.o. female.   Patient presents to urgent care for evaluation of cough, congestion, and sore throat that started approximately 2 weeks ago.  Symptoms began with fever, chills, cough, congestion, sore throat, body aches, and fatigue.  Cough and congestion have persisted.  She had no longer has a fever or chills and denies nausea, vomiting, diarrhea, abdominal pain, dizziness, headache, chest pain, shortness of breath, rash, and fatigue/body aches.  Denies chest pain, heart palpitations, and shortness of breath.  Cough is productive with yellow sputum.  Nasal congestion is also yellow.  Never smoker, denies drug use.  Denies history of chronic respiratory problems.  Taking over-the-counter medications with some temporary relief (TheraFlu and Coricidin).   Cough   Past Medical History:  Diagnosis Date   Allergy    Anemia    Arthritis    back   Hematuria    Hypertension     Patient Active Problem List   Diagnosis Date Noted   Microscopic hematuria 11/01/2014   Routine general medical examination at a health care facility 09/06/2011   Mild HTN 07/26/2011   CAROTID BRUIT, RIGHT 09/22/2007    Past Surgical History:  Procedure Laterality Date   TUBAL LIGATION  1994    OB History   No obstetric history on file.      Home Medications    Prior to Admission medications   Medication Sig Start Date End Date Taking? Authorizing Provider  Guaifenesin 1200 MG TB12 Take 1 tablet (1,200 mg total) by mouth in the morning and at bedtime. 03/14/23  Yes Carlisle Beers, FNP  promethazine-dextromethorphan (PROMETHAZINE-DM) 6.25-15 MG/5ML syrup Take 5 mLs by mouth at bedtime as needed for cough. 03/14/23  Yes Carlisle Beers, FNP  amLODipine (NORVASC) 5 MG tablet TAKE 1  TABLET(5 MG) BY MOUTH DAILY 02/17/18   Sandford Craze, NP  metFORMIN (GLUCOPHAGE) 500 MG tablet Take 500 mg by mouth 2 (two) times daily.    [provider]  Multiple Vitamin (MULTIVITAMIN) tablet Take 1 tablet by mouth daily.    [provider]  VITAMIN D, ERGOCALCIFEROL, PO Take 25 mcg by mouth daily.    [provider]    Family History Family History  Problem Relation Age of Onset   Hypertension Mother    Heart attack Father    Diabetes Sister    Hypertension Brother    Hyperlipidemia Sister    Urolithiasis Brother    Cancer Neg Hx    Kidney disease Neg Hx    Stroke Neg Hx    Colon cancer Neg Hx    Colon polyps Neg Hx    Esophageal cancer Neg Hx    Stomach cancer Neg Hx    Rectal cancer Neg Hx     Social History Social History   Tobacco Use   Smoking status: Never   Smokeless tobacco: Never  Substance Use Topics   Alcohol use: Yes    Alcohol/week: 10.0 standard drinks of alcohol    Types: 10 Standard drinks or equivalent per week    Comment: 2-3 glass wine/day,    Drug use: No     Allergies   Patient has no known allergies.   Review of Systems Review of Systems  Respiratory:  Positive for cough.   Per HPI  Physical Exam Triage Vital Signs ED Triage Vitals  Encounter Vitals Group     BP 03/14/23 1015 138/80     Systolic BP Percentile --      Diastolic BP Percentile --      Pulse Rate 03/14/23 1015 60     Resp 03/14/23 1015 14     Temp 03/14/23 1015 98 F (36.7 C)     Temp Source 03/14/23 1015 Oral     SpO2 03/14/23 1015 96 %     Weight --      Height --      Head Circumference --      Peak Flow --      Pain Score 03/14/23 1016 0     Pain Loc --      Pain Education --      Exclude from Growth Chart --    No data found.  Updated Vital Signs BP 138/80 (BP Location: Right Arm)   Pulse 60   Temp 98 F (36.7 C) (Oral)   Resp 14   LMP 05/17/2017   SpO2 96%   Visual Acuity Right Eye Distance:   Left Eye  Distance:   Bilateral Distance:    Right Eye Near:   Left Eye Near:    Bilateral Near:     Physical Exam Vitals and nursing note reviewed.  Constitutional:      Appearance: She is not ill-appearing or toxic-appearing.  HENT:     Head: Normocephalic and atraumatic.     Right Ear: Hearing, tympanic membrane, ear canal and external ear normal.     Left Ear: Hearing, tympanic membrane, ear canal and external ear normal.     Nose: Congestion present.     Mouth/Throat:     Lips: Pink.     Mouth: Mucous membranes are moist. No injury or oral lesions.     Dentition: Normal dentition.     Tongue: No lesions.     Pharynx: Oropharynx is clear. Uvula midline. No pharyngeal swelling, oropharyngeal exudate, posterior oropharyngeal erythema, uvula swelling or postnasal drip.     Tonsils: No tonsillar exudate.  Eyes:     General: Lids are normal. Vision grossly intact. Gaze aligned appropriately.     Extraocular Movements: Extraocular movements intact.     Conjunctiva/sclera: Conjunctivae normal.  Neck:     Trachea: Trachea and phonation normal.  Cardiovascular:     Rate and Rhythm: Normal rate and regular rhythm.     Heart sounds: Normal heart sounds, S1 normal and S2 normal.  Pulmonary:     Effort: Pulmonary effort is normal. No respiratory distress.     Breath sounds: Normal breath sounds and air entry. No wheezing, rhonchi or rales.  Chest:     Chest wall: No tenderness.  Musculoskeletal:     Cervical back: Neck supple.  Lymphadenopathy:     Cervical: No cervical adenopathy.  Skin:    General: Skin is warm and dry.     Capillary Refill: Capillary refill takes less than 2 seconds.     Findings: No rash.  Neurological:     General: No focal deficit present.     Mental Status: She is alert and oriented to person, place, and time. Mental status is at baseline.     Cranial Nerves: No dysarthria or facial asymmetry.  Psychiatric:        Mood and Affect: Mood normal.        Speech:  Speech normal.        Behavior: Behavior normal.        Thought Content: Thought content normal.        Judgment: Judgment normal.      UC Treatments / Results  Labs (all labs ordered are listed, but only abnormal results are displayed) Labs Reviewed - No data to display  EKG   Radiology No results found.  Procedures Procedures (including critical care time)  Medications Ordered in UC Medications - No data to display  Initial Impression / Assessment and Plan / UC Course  I have reviewed the triage vital signs and the nursing notes.  Pertinent labs & imaging results that were available during my care of the patient were reviewed by me and considered in my medical decision making (see chart for details).   1.  Viral URI with cough Suspect viral URI, viral syndrome.  Strep/viral testing: Deferred based on timing of illness Cough is [name] postviral and will improve in the next 1 to 2 weeks. No signs or symptoms consistent with bacterial sinus infection.  Patient is agreeable with deferring antibiotic prescription at this time.  Physical exam findings reassuring, vital signs hemodynamically stable, and lungs clear, therefore deferred imaging of the chest.  Advised supportive care/prescriptions for symptomatic relief as outlined in AVS.    Counseled patient on potential for adverse effects with medications prescribed/recommended today, strict ER and return-to-clinic precautions discussed, patient verbalized understanding.    Final Clinical Impressions(s) / UC Diagnoses   Final diagnoses:  Viral URI with cough     Discharge Instructions      You have a viral illness which will improve on its own with rest, fluids, and medications to help with your symptoms.  Tylenol, guaifenesin (plain mucinex), and saline nasal sprays may help relieve symptoms.   Two teaspoons of honey in 1 cup of warm water every 4-6 hours may help with throat pains. Promethazine DM at bedtime as  needed for cough.   Humidifier in room at nighttime may help soothe cough (clean well daily).   For chest pain, shortness of breath, inability to keep food or fluids down without vomiting, fever that does not respond to tylenol or motrin, or any other severe symptoms, please go to the ER for further evaluation. Return to urgent care as needed, otherwise follow-up with PCP.      ED Prescriptions     Medication Sig Dispense Auth. Provider   Guaifenesin 1200 MG TB12 Take 1 tablet (1,200 mg total) by mouth in the morning and at bedtime. 14 tablet Carlisle Beers, FNP   promethazine-dextromethorphan (PROMETHAZINE-DM) 6.25-15 MG/5ML syrup Take 5 mLs by mouth at bedtime as needed for cough. 118 mL Carlisle Beers, FNP      PDMP not reviewed this encounter.   Carlisle Beers, FNP 03/14/23 1044    Carlisle Beers, [place] 03/14/23 1045

## 2023-03-14 NOTE — Discharge Instructions (Signed)
You have a viral illness which will improve on its own with rest, fluids, and medications to help with your symptoms.  Tylenol, guaifenesin (plain mucinex), and saline nasal sprays may help relieve symptoms.   Two teaspoons of honey in 1 cup of warm water every 4-6 hours may help with throat pains. Promethazine DM at bedtime as needed for cough.   Humidifier in room at nighttime may help soothe cough (clean well daily).   For chest pain, shortness of breath, inability to keep food or fluids down without vomiting, fever that does not respond to tylenol or motrin, or any other severe symptoms, please go to the ER for further evaluation. Return to urgent care as needed, otherwise follow-up with PCP.

## 2023-03-18 ENCOUNTER — Other Ambulatory Visit: Payer: Self-pay | Admitting: Obstetrics and Gynecology

## 2023-03-18 DIAGNOSIS — R928 Other abnormal and inconclusive findings on diagnostic imaging of breast: Secondary | ICD-10-CM

## 2023-04-02 ENCOUNTER — Encounter: Payer: Self-pay | Admitting: Emergency Medicine

## 2023-04-02 ENCOUNTER — Ambulatory Visit
Admission: EM | Admit: 2023-04-02 | Discharge: 2023-04-02 | Disposition: A | Discharge status: Home / Self Care | Payer: No Typology Code available for payment source | Attending: Internal Medicine | Admitting: Internal Medicine

## 2023-04-02 DIAGNOSIS — E1169 Type 2 diabetes mellitus with other specified complication: Secondary | ICD-10-CM | POA: Diagnosis not present

## 2023-04-02 DIAGNOSIS — M62838 Other muscle spasm: Secondary | ICD-10-CM | POA: Diagnosis not present

## 2023-04-02 DIAGNOSIS — R1010 Upper abdominal pain, unspecified: Secondary | ICD-10-CM

## 2023-04-02 MED ORDER — PROMETHAZINE-DM 6.25-15 MG/5ML PO SYRP: 5.0000 mL | ORAL_SOLUTION | Freq: Every evening | ORAL | 0 refills | Status: DC | PRN

## 2023-04-02 MED ORDER — BACLOFEN 10 MG PO TABS: 10.0000 mg | ORAL_TABLET | Freq: Three times a day (TID) | ORAL | 0 refills | Status: DC

## 2023-04-02 MED ORDER — FAMOTIDINE 20 MG PO TABS: 20.0000 mg | ORAL_TABLET | Freq: Every day | ORAL | 0 refills | Status: DC

## 2023-04-02 MED ORDER — OMEPRAZOLE 20 MG PO CPDR: 20.0000 mg | DELAYED_RELEASE_CAPSULE | Freq: Every day | ORAL | 0 refills | Status: DC

## 2023-04-02 NOTE — ED Provider Notes (Signed)
 [name]    CSN: [id number] Arrival date & time: 04/02/23  9170      History   Chief Complaint Chief Complaint  Patient presents with   Abdominal Cramping    HPI [name] [name] is a 58 y.o. female.   [name] [name] is a 58 y.o. female presenting for chief complaint of Abdominal Cramping to the bilateral upper abdomen that started approximately 5 days ago. Abdominal cramping happens randomly and is intermittent. Describes pain as a squeezing tightness that happens for a 5-10 seconds then goes away on its own. Movement and coughing do not trigger pain. She is unsure of triggering factors for symptoms and suspects eating something may help but is unsure. She has had poor appetite over the last few weeks and has had a persistent cough for the last approximately 1 month. Denies shortness of breath, chest tightness, fevers, chills, nausea, vomiting, diarrhea, lower abdominal pain, flank pain. Denies acid reflux symptoms and history of abdominal surgeries. Denies frequent intake of NSAIDs, hematemesis and melena. She has tried tylenol  for pain without much relief.    Abdominal Cramping    Past Medical History:  Diagnosis Date   Allergy    Anemia    Arthritis    back   Hematuria    Hypertension     Patient Active Problem List   Diagnosis Date Noted   Microscopic hematuria 11/01/2014   Routine general medical examination at a health care facility 09/06/2011   Mild HTN 07/26/2011   CAROTID BRUIT, RIGHT 09/22/2007    Past Surgical History:  Procedure Laterality Date   TUBAL LIGATION  1994    OB History   No obstetric history on file.      Home Medications    Prior to Admission medications   Medication Sig Start Date End Date Taking? Authorizing Provider  baclofen  (LIORESAL ) 10 MG tablet Take 1 tablet (10 mg total) by mouth 3 (three) times daily. 04/02/23  Yes Enedelia Dorna HERO, FNP  famotidine  (PEPCID ) 20 MG tablet Take 1 tablet (20 mg total) by mouth at  bedtime. 04/02/23  Yes Enedelia Dorna HERO, FNP  omeprazole  (PRILOSEC) 20 MG capsule Take 1 capsule (20 mg total) by mouth daily with breakfast. 04/02/23  Yes Enedelia Dorna HERO, FNP  promethazine -dextromethorphan (PROMETHAZINE -DM) 6.25-15 MG/5ML syrup Take 5 mLs by mouth at bedtime as needed for cough. 04/02/23  Yes Enedelia Dorna HERO, FNP  amLODipine  (NORVASC ) 5 MG tablet TAKE 1 TABLET(5 MG) BY MOUTH DAILY 02/17/18   Daryl Setter, NP  Guaifenesin  1200 MG TB12 Take 1 tablet (1,200 mg total) by mouth in the morning and at bedtime. 03/14/23   Enedelia Dorna HERO, FNP  metFORMIN (GLUCOPHAGE) 500 MG tablet Take 500 mg by mouth 2 (two) times daily.    [provider]  Multiple Vitamin (MULTIVITAMIN) tablet Take 1 tablet by mouth daily.    [provider]  VITAMIN D, ERGOCALCIFEROL, PO Take 25 mcg by mouth daily.    [provider]    Family History Family History  Problem Relation Age of Onset   Hypertension Mother    Heart attack Father    Diabetes Sister    Hypertension Brother    Hyperlipidemia Sister    Urolithiasis Brother    Cancer Neg Hx    Kidney disease Neg Hx    Stroke Neg Hx    Colon cancer Neg Hx    Colon polyps Neg Hx    Esophageal cancer Neg Hx    Stomach  cancer Neg Hx    Rectal cancer Neg Hx     Social History Social History   Tobacco Use   Smoking status: Never   Smokeless tobacco: Never  Substance Use Topics   Alcohol use: Yes    Alcohol/week: 10.0 standard drinks of alcohol    Types: 10 Standard drinks or equivalent per week    Comment: 2-3 glass wine/day,    Drug use: No     Allergies   Patient has no known allergies.   Review of Systems Review of Systems Per HPI  Physical Exam Triage Vital Signs ED Triage Vitals  Encounter Vitals Group     BP 04/02/23 0850 105/66     Systolic BP Percentile --      Diastolic BP Percentile --      Pulse Rate 04/02/23 0850 62     Resp 04/02/23 0850 14     Temp 04/02/23 0850  98.2 F (36.8 C)     Temp Source 04/02/23 0850 Oral     SpO2 04/02/23 0850 97 %     Weight --      Height --      Head Circumference --      Peak Flow --      Pain Score 04/02/23 0902 7     Pain Loc --      Pain Education --      Exclude from Growth Chart --    No data found.  Updated Vital Signs BP 105/66 (BP Location: Right Arm)   Pulse 62   Temp 98.2 F (36.8 C) (Oral)   Resp 14   LMP 05/17/2017   SpO2 97%   Visual Acuity Right Eye Distance:   Left Eye Distance:   Bilateral Distance:    Right Eye Near:   Left Eye Near:    Bilateral Near:     Physical Exam Vitals and nursing note reviewed.  Constitutional:      Appearance: She is not ill-appearing or toxic-appearing.  HENT:     Head: Normocephalic and atraumatic.     Right Ear: Hearing, tympanic membrane, ear canal and external ear normal.     Left Ear: Hearing, tympanic membrane, ear canal and external ear normal.     Nose: Nose normal.     Mouth/Throat:     Lips: Pink.     Mouth: Mucous membranes are moist. No injury or oral lesions.     Dentition: Normal dentition.     Tongue: No lesions.     Pharynx: Oropharynx is clear. Uvula midline. No pharyngeal swelling, oropharyngeal exudate, posterior oropharyngeal erythema, uvula swelling or postnasal drip.     Tonsils: No tonsillar exudate.  Eyes:     General: Lids are normal. Vision grossly intact. Gaze aligned appropriately.     Extraocular Movements: Extraocular movements intact.     Conjunctiva/sclera: Conjunctivae normal.  Neck:     Trachea: Trachea and phonation normal.  Cardiovascular:     Rate and Rhythm: Normal rate and regular rhythm.     Heart sounds: Normal heart sounds, S1 normal and S2 normal.  Pulmonary:     Effort: Pulmonary effort is normal. No respiratory distress.     Breath sounds: Normal breath sounds and air entry.  Abdominal:     General: Abdomen is flat. Bowel sounds are normal. There is no distension.     Palpations: Abdomen is  soft. There is no mass.     Tenderness: There is abdominal tenderness in the right  upper quadrant, epigastric area and left upper quadrant. There is no right CVA tenderness, left CVA tenderness, guarding or rebound. Negative signs include Murphy's sign and McBurney's sign.     Hernia: No hernia is present.  Musculoskeletal:     Cervical back: Neck supple.  Lymphadenopathy:     Cervical: No cervical adenopathy.  Skin:    General: Skin is warm and dry.     Capillary Refill: Capillary refill takes less than 2 seconds.     Findings: No rash.  Neurological:     General: No focal deficit present.     Mental Status: She is alert and oriented to person, place, and time. Mental status is at baseline.     Cranial Nerves: No dysarthria or facial asymmetry.  Psychiatric:        Mood and Affect: Mood normal.        Speech: Speech normal.        Behavior: Behavior normal.        Thought Content: Thought content normal.        Judgment: Judgment normal.      UC Treatments / Results  Labs (all labs ordered are listed, but only abnormal results are displayed) Labs Reviewed - No data to display  EKG   Radiology No results found.  Procedures Procedures (including critical care time)  Medications Ordered in UC Medications - No data to display  Initial Impression / Assessment and Plan / UC Course  I have reviewed the triage vital signs and the nursing notes.  Pertinent labs & imaging results that were available during my care of the patient were reviewed by me and considered in my medical decision making (see chart for details).   1. Spasm of abdominal muscles, type 2 diabetes with other specified complications, upper abdominal pain Unclear etiology of patient's symptoms.  Low suspicion for kidney stone, cholecystitis, pancreatitis, colitis. Abdominal exam is without peritoneal signs. Vital signs stable, afebrile, non-toxic.  Question dyspepsia etiology, therefore will treat with  famotidine  and omeprazole  daily for 30 days. Dietary and lifestyle changes discussed to avoid excessive stomach acid buildup and eating 5 smaller meals to avoid long periods of empty stomach. Additionally question muscular spasm etiology which will be treated with heat, stretches, and as needed use of baclofen  muscle relaxer.  Lungs clear therefore deferred imaging of the chest. Promethazine  DM at bedtime ordered to be used as needed for cough.  Advised PCP follow-up for further workup and evaluation should symptoms persist.  Counseled patient on potential for adverse effects with medications prescribed/recommended today, strict ER and return-to-clinic precautions discussed, patient verbalized understanding.    Final Clinical Impressions(s) / UC Diagnoses   Final diagnoses:  Spasm of abdominal muscles  Type 2 diabetes mellitus with other specified complication, without long-term current use of insulin (HCC)     Discharge Instructions      I suspect your abdominal cramping may be secondary to muscle spasm.  Continue using heat and gentle range of motion exercises as needed. Take baclofen  muscle relaxer every 8 hours as needed for muscle spasm (this may make you drowsy so only take at bedtime mostly).  Additionally, I am concerned you may have increased acid production in your stomach contributing to your symptoms. Take omeprazole  with breakfast daily for the next 30 days, take famotidine  before bedtime daily for the next 30 days.  Avoid foods that are citrus based including lemons, tomatoes, pizza, etc. Caffeine and chocolate may also trigger the symptoms.  Eat  5 small meals daily and avoid going long periods of time without food on your stomach.  Drink plenty of water (64 ounces of water per day).  Please schedule a follow-up appointment with your primary care provider as needed. If you develop any new or worsening symptoms or if your symptoms do not start to improve, please  return here or follow-up with your primary care provider. If your symptoms are severe, please go to the emergency room.     ED Prescriptions     Medication Sig Dispense Auth. Provider   promethazine -dextromethorphan (PROMETHAZINE -DM) 6.25-15 MG/5ML syrup Take 5 mLs by mouth at bedtime as needed for cough. 118 mL Enedelia Going M, FNP   omeprazole  (PRILOSEC) 20 MG capsule Take 1 capsule (20 mg total) by mouth daily with breakfast. 30 capsule Enedelia Going M, FNP   famotidine  (PEPCID ) 20 MG tablet Take 1 tablet (20 mg total) by mouth at bedtime. 30 tablet Vonette Grosso M, FNP   baclofen  (LIORESAL ) 10 MG tablet Take 1 tablet (10 mg total) by mouth 3 (three) times daily. 30 each Enedelia Going HERO, FNP      PDMP not reviewed this encounter.   Enedelia Going HERO, [place] 04/04/23 2025

## 2023-04-02 NOTE — ED Triage Notes (Signed)
 Pt c/o abdominal pain. States she has been sick a lot and taking lot of medicine and began having stomach cramps on Saturday.

## 2023-04-02 NOTE — Discharge Instructions (Addendum)
 I suspect your abdominal cramping may be secondary to muscle spasm.  Continue using heat and gentle range of motion exercises as needed. Take baclofen  muscle relaxer every 8 hours as needed for muscle spasm (this may make you drowsy so only take at bedtime mostly).  Additionally, I am concerned you may have increased acid production in your stomach contributing to your symptoms. Take omeprazole  with breakfast daily for the next 30 days, take famotidine  before bedtime daily for the next 30 days.  Avoid foods that are citrus based including lemons, tomatoes, pizza, etc. Caffeine and chocolate may also trigger the symptoms.  Eat 5 small meals daily and avoid going long periods of time without food on your stomach.  Drink plenty of water (64 ounces of water per day).  Please schedule a follow-up appointment with your primary care provider as needed. If you develop any new or worsening symptoms or if your symptoms do not start to improve, please return here or follow-up with your primary care provider. If your symptoms are severe, please go to the emergency room.

## 2023-04-21 ENCOUNTER — Ambulatory Visit: Payer: No Typology Code available for payment source

## 2023-04-21 ENCOUNTER — Ambulatory Visit
Admission: RE | Admit: 2023-04-21 | Discharge: 2023-04-21 | Disposition: A | Discharge status: Home / Self Care | Payer: No Typology Code available for payment source | Source: Ambulatory Visit | Attending: Obstetrics and Gynecology | Admitting: Obstetrics and Gynecology

## 2023-04-21 ENCOUNTER — Other Ambulatory Visit: Payer: Self-pay | Admitting: Obstetrics and Gynecology

## 2023-04-21 DIAGNOSIS — R928 Other abnormal and inconclusive findings on diagnostic imaging of breast: Secondary | ICD-10-CM

## 2023-04-21 DIAGNOSIS — R921 Mammographic calcification found on diagnostic imaging of breast: Secondary | ICD-10-CM

## 2023-04-26 DIAGNOSIS — C50919 Malignant neoplasm of unspecified site of unspecified female breast: Secondary | ICD-10-CM

## 2023-04-26 HISTORY — DX: Malignant neoplasm of unspecified site of unspecified female breast: C50.919

## 2023-05-01 ENCOUNTER — Other Ambulatory Visit: Payer: Self-pay | Admitting: Obstetrics and Gynecology

## 2023-05-01 ENCOUNTER — Ambulatory Visit
Admission: RE | Admit: 2023-05-01 | Discharge: 2023-05-01 | Disposition: A | Discharge status: Home / Self Care | Payer: No Typology Code available for payment source | Source: Ambulatory Visit | Attending: Obstetrics and Gynecology | Admitting: Obstetrics and Gynecology

## 2023-05-01 ENCOUNTER — Ambulatory Visit
Admission: RE | Admit: 2023-05-01 | Discharge: 2023-05-01 | Disposition: A | Discharge status: Home / Self Care | Source: Ambulatory Visit | Attending: Obstetrics and Gynecology | Admitting: Obstetrics and Gynecology

## 2023-05-01 DIAGNOSIS — R921 Mammographic calcification found on diagnostic imaging of breast: Secondary | ICD-10-CM

## 2023-05-01 HISTORY — PX: BREAST BIOPSY: SHX20

## 2023-05-02 ENCOUNTER — Other Ambulatory Visit: Payer: Self-pay | Admitting: Obstetrics and Gynecology

## 2023-05-02 ENCOUNTER — Telehealth: Payer: Self-pay | Admitting: *Deleted

## 2023-05-02 ENCOUNTER — Encounter: Payer: Self-pay | Admitting: Obstetrics and Gynecology

## 2023-05-02 DIAGNOSIS — R928 Other abnormal and inconclusive findings on diagnostic imaging of breast: Secondary | ICD-10-CM

## 2023-05-02 DIAGNOSIS — R921 Mammographic calcification found on diagnostic imaging of breast: Secondary | ICD-10-CM

## 2023-05-02 LAB — SURGICAL PATHOLOGY

## 2023-05-02 NOTE — Telephone Encounter (Signed)
 LVM to patient in reference to upcoming appointment, left my contact to call back, sent packet.

## 2023-05-05 ENCOUNTER — Encounter: Payer: Self-pay | Admitting: *Deleted

## 2023-05-05 DIAGNOSIS — D0511 Intraductal carcinoma in situ of right breast: Secondary | ICD-10-CM | POA: Insufficient documentation

## 2023-05-05 NOTE — Progress Notes (Signed)
 Radiation Oncology         [phone]) [phone] ________________________________  Multidisciplinary Breast Oncology Clinic [facility]) Initial Outpatient Consultation  Name: [name] [name] MRN: [id number]  Date: 05/07/2023  DOB: 1965/04/06  CC:Pa, [facility]  Griselda Miner, MD   REFERRING PHYSICIAN: Chevis Pretty III, MD  DIAGNOSIS: There were no encounter diagnoses.  Stage 0 right breast UOQ, Ductal Carcinoma in Situ, ER- / PR-,Grade 3  No diagnosis found.  HISTORY OF PRESENT ILLNESS::[name] [name] is a 58 y.o. female who is presenting to the office today for evaluation of her newly diagnosed breast cancer. She is accompanied by ***. She is doing well overall.   She had routine screening mammography on showing a possible abnormality in the right breast. She underwent bilateral diagnostic mammography with tomography at on 04/21/2023 showing: 2.6 cm group of indeterminate microcalcifications over the right outer lower quadrant; and two groups of probable benign microcalcifications over the upper outer left breast were also visualized.  Biopsy on 05/01/2023 showed: high-grade ductal carcinoma in situ in the upper outer position along with high-grade ductal carcinoma in situ in the upper outer anterior position. Prognostic indicators significant for: estrogen receptor negative and progesterone receptor negative.   Menarche: *** years old Age at first live birth: *** years old GP: *** LMP: *** Contraceptive: *** HRT: ***   The patient was referred today for presentation in the multidisciplinary conference.  Radiology studies and pathology slides were presented there for review and discussion of treatment options.  A consensus was discussed regarding potential next steps.  PREVIOUS RADIATION THERAPY: {EXAM; YES/NO:19492::"No"}  PAST MEDICAL HISTORY:  Past Medical History:  Diagnosis Date   Allergy    Anemia    Arthritis    back   Hematuria    Hypertension     PAST SURGICAL  HISTORY: Past Surgical History:  Procedure Laterality Date   BREAST BIOPSY Right 05/01/2023   MM RT BREAST BX W LOC DEV 1ST LESION IMAGE BX SPEC STEREO GUIDE 05/01/2023 [facility]   BREAST BIOPSY Right 05/01/2023   MM RT BREAST BX W LOC DEV EA AD LESION IMG BX SPEC STEREO GUIDE 05/01/2023 [facility]   TUBAL LIGATION  1994    FAMILY HISTORY:  Family History  Problem Relation Age of Onset   Hypertension Mother    Heart attack Father    Diabetes Sister    Hypertension Brother    Hyperlipidemia Sister    Urolithiasis Brother    Cancer Neg Hx    Kidney disease Neg Hx    Stroke Neg Hx    Colon cancer Neg Hx    Colon polyps Neg Hx    Esophageal cancer Neg Hx    Stomach cancer Neg Hx    Rectal cancer Neg Hx     SOCIAL HISTORY:  Social History   Socioeconomic History   Marital status: Significant Other    Spouse name: Not on file   Number of children: 2   Years of education: Not on file   Highest education level: Not on file  Occupational History    Employer: [employer]   Tobacco Use   Smoking status: Never   Smokeless tobacco: Never  Substance and Sexual Activity   Alcohol use: Yes    Alcohol/week: 10.0 standard drinks of alcohol    Types: 10 Standard drinks or equivalent per week    Comment: 2-3 glass wine/day,    Drug use: No   Sexual activity: Not on file  Other Topics Concern   Not on file  Social History Narrative   REGULAR EXERCISE:  3 X WEEKLY   CAFFEINE USE:  NO   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   Divorced   2 children (age 52 and 80)            Social Drivers of Corporate investment banker Strain: Not on Ship broker Insecurity: Not on file  Transportation Needs: Not on file  Physical Activity: Not on file  Stress: Not on file  Social Connections: Unknown (07/10/2021)   Received from [facility], [facility]   Social Network    Social Network: Not on file    ALLERGIES: No Known Allergies  MEDICATIONS:   Current Outpatient Medications  Medication Sig Dispense Refill   amLODipine (NORVASC) 5 MG tablet TAKE 1 TABLET(5 MG) BY MOUTH DAILY 30 tablet 0   baclofen (LIORESAL) 10 MG tablet Take 1 tablet (10 mg total) by mouth 3 (three) times daily. 30 each 0   famotidine (PEPCID) 20 MG tablet Take 1 tablet (20 mg total) by mouth at bedtime. 30 tablet 0   Guaifenesin 1200 MG TB12 Take 1 tablet (1,200 mg total) by mouth in the morning and at bedtime. 14 tablet 0   metFORMIN (GLUCOPHAGE) 500 MG tablet Take 500 mg by mouth 2 (two) times daily.     Multiple Vitamin (MULTIVITAMIN) tablet Take 1 tablet by mouth daily.     omeprazole (PRILOSEC) 20 MG capsule Take 1 capsule (20 mg total) by mouth daily with breakfast. 30 capsule 0   promethazine-dextromethorphan (PROMETHAZINE-DM) 6.25-15 MG/5ML syrup Take 5 mLs by mouth at bedtime as needed for cough. 118 mL 0   VITAMIN D, ERGOCALCIFEROL, PO Take 25 mcg by mouth daily.     Current Facility-Administered Medications  Medication Dose Route Frequency Provider Last Rate Last Admin   0.9 %  sodium chloride infusion  500 mL Intravenous Once Danis, Andreas Blower, MD        REVIEW OF SYSTEMS: A 10+ POINT REVIEW OF SYSTEMS WAS OBTAINED including neurology, dermatology, psychiatry, cardiac, respiratory, lymph, extremities, GI, GU, musculoskeletal, constitutional, reproductive, HEENT. On the provided form, she reports ***. She denies *** and any other symptoms.    PHYSICAL EXAM:  vitals were not taken for this visit.  {may need to copy over vitals} Lungs are clear to auscultation bilaterally. Heart has regular rate and rhythm. No palpable cervical, supraclavicular, or axillary adenopathy. Abdomen soft, non-tender, normal bowel sounds. Breast: *** breast with no palpable mass, nipple discharge, or bleeding. *** breast with ***.   KPS = ***  100 - Normal; no complaints; no evidence of disease. 90   - Able to carry on normal activity; minor signs or symptoms of  disease. 80   - Normal activity with effort; some signs or symptoms of disease. 3   - Cares for self; unable to carry on normal activity or to do active work. 60   - Requires occasional assistance, but is able to care for most of his personal needs. 50   - Requires considerable assistance and frequent medical care. 40   - Disabled; requires special care and assistance. 30   - Severely disabled; hospital admission is indicated although death not imminent. 20   - Very sick; hospital admission necessary; active supportive treatment necessary. 10   - Moribund; fatal processes progressing rapidly. 0     - Dead  Karnofsky DA, Abelmann WH, Craver LS and  Burchenal JH [phone]) The use of the nitrogen mustards in the palliative treatment of carcinoma: with particular reference to bronchogenic carcinoma Cancer 1 634-56  LABORATORY DATA:  Lab Results  Component Value Date   WBC 9.9 08/29/2016   HGB 11.6 (L) 08/29/2016   HCT 35.6 (L) 08/29/2016   MCV 88.7 08/29/2016   PLT 337.0 08/29/2016   Lab Results  Component Value Date   NA 136 08/29/2016   K 4.0 08/29/2016   CL 102 08/29/2016   CO2 28 08/29/2016   Lab Results  Component Value Date   ALT 17 08/29/2016   AST 17 08/29/2016   ALKPHOS 41 08/29/2016   BILITOT 0.2 08/29/2016    PULMONARY FUNCTION TEST:   Review Flowsheet        No data to display          RADIOGRAPHY: MM CLIP PLACEMENT RIGHT Result Date: 05/01/2023 CLINICAL DATA:  Post stereotactic guided core biopsy of calcifications in the upper-outer posterior right breast and stereotactic guided core biopsy of calcifications in the upper outer anterior right breast. EXAM: 3D DIAGNOSTIC RIGHT MAMMOGRAM POST STEREOTACTIC BIOPSY COMPARISON:  Previous exam(s). ACR Breast Density Category c: The breasts are heterogeneously dense, which may obscure small masses. FINDINGS: 3D Mammographic images were obtained following stereotactic guided core biopsy of calcifications in the upper-outer  posterior right breast and stereotactic guided core biopsy of calcifications in the upper outer anterior right breast. An X shaped biopsy marking clip is present and located approximately 1.8 cm medial to the biopsied calcifications in the upper-outer posterior right breast. A ribbon shaped biopsy marking clip is present at the site of the biopsied calcifications in the upper outer anterior right breast. IMPRESSION: 1. X shaped biopsy marking clip 1.8 cm medial to biopsy calcifications in the upper-outer posterior right breast. 2. Ribbon shaped biopsy marking clip at site of biopsied calcifications in the upper outer anterior right breast. Final Assessment: Post Procedure Mammograms for Marker Placement Electronically Signed   By: Edwin Cap M.D.   On: 05/01/2023 10:17   MM RT BREAST BX W LOC DEV 1ST LESION IMAGE BX SPEC STEREO GUIDE Result Date: 05/01/2023 CLINICAL DATA:  Patient presents for stereotactic guided core biopsy of 2 groups of calcifications in the upper-outer right breast all together spanning 2.6 cm. EXAM: RIGHT BREAST STEREOTACTIC CORE NEEDLE BIOPSY COMPARISON:  Previous exam(s). FINDINGS: The patient and I discussed the procedure of stereotactic-guided biopsy including benefits and alternatives. We discussed the high likelihood of a successful procedure. We discussed the risks of the procedure including infection, bleeding, tissue injury, clip migration, and inadequate sampling. Informed written consent was given. The usual time out protocol was performed immediately prior to the procedure. SITE 1: UPPER-OUTER POSTERIOR: CALCIFICATIONS: Using sterile technique and 1% Lidocaine as local anesthetic, under stereotactic guidance, a 9 gauge vacuum assisted device was used to perform core needle biopsy of THE CALCIFICATIONS IN THE UPPER-OUTER POSTERIOR RIGHT BREAST using a LATERAL TO MEDIAL approach. Specimen radiograph was performed showing THE PRESENCE OF CALCIFICATIONS. Specimens with  calcifications are identified for pathology. Lesion quadrant: UPPER OUTER At the conclusion of the procedure, AN X shaped tissue marker clip was deployed into the biopsy cavity. Follow-up 2-view mammogram was performed and dictated separately. SITE 2: UPPER-OUTER ANTERIOR: CALCIFICATIONS: Using sterile technique and 1% Lidocaine as local anesthetic, under stereotactic guidance, a 9 gauge vacuum assisted device was used to perform core needle biopsy of THE CALCIFICATIONS IN THE UPPER-OUTER ANTERIOR RIGHT BREAST using a LATERAL TO MEDIAL  approach. Specimen radiograph was performed showing THE PRESENCE OF CALCIFICATIONS. Specimens with calcifications are identified for pathology. Lesion quadrant: UPPER OUTER At the conclusion of the procedure, A RIBBON shaped tissue marker clip was deployed into the biopsy cavity. Follow-up 2-view mammogram was performed and dictated separately. IMPRESSION: 1. Stereotactic guided biopsy of calcifications in the upper-outer posterior right breast, at site of X shaped biopsy marking clip. 2. Stereotactic guided biopsy of calcifications in the upper outer anterior right breast, at site of ribbon shaped biopsy marking clip. Electronically Signed   By: Edwin Cap M.D.   On: 05/01/2023 10:15   MM RT BREAST BX W LOC DEV EA AD LESION IMG BX SPEC STEREO GUIDE Result Date: 05/01/2023 CLINICAL DATA:  Patient presents for stereotactic guided core biopsy of 2 groups of calcifications in the upper-outer right breast all together spanning 2.6 cm. EXAM: RIGHT BREAST STEREOTACTIC CORE NEEDLE BIOPSY COMPARISON:  Previous exam(s). FINDINGS: The patient and I discussed the procedure of stereotactic-guided biopsy including benefits and alternatives. We discussed the high likelihood of a successful procedure. We discussed the risks of the procedure including infection, bleeding, tissue injury, clip migration, and inadequate sampling. Informed written consent was given. The usual time out protocol  was performed immediately prior to the procedure. SITE 1: UPPER-OUTER POSTERIOR: CALCIFICATIONS: Using sterile technique and 1% Lidocaine as local anesthetic, under stereotactic guidance, a 9 gauge vacuum assisted device was used to perform core needle biopsy of THE CALCIFICATIONS IN THE UPPER-OUTER POSTERIOR RIGHT BREAST using a LATERAL TO MEDIAL approach. Specimen radiograph was performed showing THE PRESENCE OF CALCIFICATIONS. Specimens with calcifications are identified for pathology. Lesion quadrant: UPPER OUTER At the conclusion of the procedure, AN X shaped tissue marker clip was deployed into the biopsy cavity. Follow-up 2-view mammogram was performed and dictated separately. SITE 2: UPPER-OUTER ANTERIOR: CALCIFICATIONS: Using sterile technique and 1% Lidocaine as local anesthetic, under stereotactic guidance, a 9 gauge vacuum assisted device was used to perform core needle biopsy of THE CALCIFICATIONS IN THE UPPER-OUTER ANTERIOR RIGHT BREAST using a LATERAL TO MEDIAL approach. Specimen radiograph was performed showing THE PRESENCE OF CALCIFICATIONS. Specimens with calcifications are identified for pathology. Lesion quadrant: UPPER OUTER At the conclusion of the procedure, A RIBBON shaped tissue marker clip was deployed into the biopsy cavity. Follow-up 2-view mammogram was performed and dictated separately. IMPRESSION: 1. Stereotactic guided biopsy of calcifications in the upper-outer posterior right breast, at site of X shaped biopsy marking clip. 2. Stereotactic guided biopsy of calcifications in the upper outer anterior right breast, at site of ribbon shaped biopsy marking clip. Electronically Signed   By: Edwin Cap M.D.   On: 05/01/2023 10:15   MM 3D DIAGNOSTIC MAMMOGRAM BILATERAL BREAST Result Date: 04/21/2023 CLINICAL DATA:  Recall from screening to evaluate bilateral microcalcifications as well as possible right breast asymmetry. EXAM: DIGITAL DIAGNOSTIC BILATERAL MAMMOGRAM WITH  TOMOSYNTHESIS AND CAD TECHNIQUE: Bilateral digital diagnostic mammography and breast tomosynthesis was performed. The images were evaluated with computer-aided detection. COMPARISON:  Previous exam(s). ACR Breast Density Category b: There are scattered areas of fibroglandular density. FINDINGS: Multiple additional views of both breast were obtained. The questionable screening asymmetry over the upper outer right breast does not persist and is compatible with overlapping fibroglandular tissue. Additional magnification images of the right breast demonstrate a 2.7 cm group of indeterminate microcalcifications over the outer lower quadrant with those over the posterior aspect of this group of microcalcifications demonstrating a linear orientation. Magnification images of the left breast demonstrate 2 groups  of microcalcifications over the upper outer quadrant in the middle to anterior third. The more anterior 5 mm group demonstrates some evidence of layering on the lateral image as these are likely benign, but remain indeterminate. The more posterior 7 mm group is likely benign but indeterminate. IMPRESSION: 1. 2.6 cm group of indeterminate microcalcifications over the right outer lower quadrant as described. 2. Two groups of probable benign microcalcifications over the upper outer left breast. RECOMMENDATION: Recommend 1 versus 2 site stereotactic core needle biopsy of the indeterminate right breast microcalcifications as described over the outer lower quadrant. If benign, recommend six-month follow-up diagnostic left breast mammogram for surveillance of the left breast microcalcifications. If malignant, would recommend additional stereotactic biopsy of the left breast microcalcifications. I have discussed the findings and recommendations with the patient. If applicable, a reminder letter will be sent to the patient regarding the next appointment. BI-RADS CATEGORY  4: Suspicious. Biopsy scheduling will be facilitated  by the mammography staff prior to patient's departure. Electronically Signed   By: Elberta Fortis M.D.   On: 04/21/2023 09:55      IMPRESSION: ***   Patient will be a good candidate for breast conservation with radiotherapy to *** breast. We discussed the general course of radiation, potential side effects, and toxicities with radiation and the patient is interested in this approach. ***   PLAN:  ***    ------------------------------------------------   Bryan Lemma, PA-C   Billie Lade, PhD, MD   [facility]  Radiation Oncology Direct Dial: [phone]  Fax: [phone] [email or web address]

## 2023-05-07 ENCOUNTER — Inpatient Hospital Stay: Attending: Hematology and Oncology | Admitting: Hematology and Oncology

## 2023-05-07 ENCOUNTER — Inpatient Hospital Stay: Payer: Self-pay

## 2023-05-07 ENCOUNTER — Encounter: Payer: Self-pay | Admitting: Genetic Counselor

## 2023-05-07 ENCOUNTER — Encounter: Payer: Self-pay | Admitting: *Deleted

## 2023-05-07 ENCOUNTER — Ambulatory Visit: Payer: Self-pay | Admitting: Physical Therapy

## 2023-05-07 ENCOUNTER — Ambulatory Visit (HOSPITAL_BASED_OUTPATIENT_CLINIC_OR_DEPARTMENT_OTHER): Admitting: Genetic Counselor

## 2023-05-07 ENCOUNTER — Ambulatory Visit
Admission: RE | Admit: 2023-05-07 | Discharge: 2023-05-07 | Disposition: A | Discharge status: Home / Self Care | Source: Ambulatory Visit | Attending: Radiation Oncology | Admitting: Radiation Oncology

## 2023-05-07 ENCOUNTER — Encounter: Payer: Self-pay | Admitting: General Practice

## 2023-05-07 VITALS — BP 140/61 | HR 59 | Temp 98.0°F | Resp 18 | Ht 64.0 in | Wt 164.9 lb

## 2023-05-07 DIAGNOSIS — D0511 Intraductal carcinoma in situ of right breast: Secondary | ICD-10-CM | POA: Diagnosis not present

## 2023-05-07 LAB — CMP (CANCER CENTER ONLY)
ALT: 12 U/L (ref 0–44)
AST: 14 U/L — ABNORMAL LOW (ref 15–41)
Albumin: 4.1 g/dL (ref 3.5–5.0)
Alkaline Phosphatase: 59 U/L (ref 38–126)
Anion gap: 4 — ABNORMAL LOW (ref 5–15)
BUN: 20 mg/dL (ref 6–20)
CO2: 30 mmol/L (ref 22–32)
Calcium: 9 mg/dL (ref 8.9–10.3)
Chloride: 105 mmol/L (ref 98–111)
Creatinine: 1.02 mg/dL — ABNORMAL HIGH (ref 0.44–1.00)
GFR, Estimated: >60 mL/min (ref >60)
Glucose, Bld: 92 mg/dL (ref 70–99)
Potassium: 4.2 mmol/L (ref 3.5–5.1)
Sodium: 139 mmol/L (ref 135–145)
Total Bilirubin: 0.3 mg/dL (ref 0.0–1.2)
Total Protein: 6.5 g/dL (ref 6.5–8.1)

## 2023-05-07 LAB — CBC WITH DIFFERENTIAL (CANCER CENTER ONLY)
Abs Immature Granulocytes: 0.02 10*3/uL (ref 0.00–0.07)
Basophils Absolute: 0 10*3/uL (ref 0.0–0.1)
Basophils Relative: 0 %
Eosinophils Absolute: 0.1 10*3/uL (ref 0.0–0.5)
Eosinophils Relative: 2 %
HCT: 34.5 % — ABNORMAL LOW (ref 36.0–46.0)
Hemoglobin: 10.9 g/dL — ABNORMAL LOW (ref 12.0–15.0)
Immature Granulocytes: 0 %
Lymphocytes Relative: 40 %
Lymphs Abs: 3.2 10*3/uL (ref 0.7–4.0)
MCH: 28.1 pg (ref 26.0–34.0)
MCHC: 31.6 g/dL (ref 30.0–36.0)
MCV: 88.9 fL (ref 80.0–100.0)
Monocytes Absolute: 0.8 10*3/uL (ref 0.1–1.0)
Monocytes Relative: 10 %
Neutro Abs: 3.7 10*3/uL (ref 1.7–7.7)
Neutrophils Relative %: 48 %
Platelet Count: 327 10*3/uL (ref 150–400)
RBC: 3.88 MIL/uL (ref 3.87–5.11)
RDW: 13.3 % (ref 11.5–15.5)
WBC Count: 7.9 10*3/uL (ref 4.0–10.5)
nRBC: 0 % (ref 0.0–0.2)

## 2023-05-07 LAB — GENETIC SCREENING ORDER

## 2023-05-07 NOTE — Progress Notes (Signed)
 [facility] Multidisciplinary Clinic Spiritual Care Note  Met with [name] [name] and her SO [name] [name] in Breast Multidisciplinary Clinic to introduce Support Center team/resources.  [name] [name] completed SDOH screening; results follow below.    SDOH Screenings   Food Insecurity: No Food Insecurity (05/07/2023)  Housing: Low Risk  (05/07/2023)  Transportation Needs: No Transportation Needs (05/07/2023)  Utilities: Not At Risk (05/07/2023)  Depression (PHQ2-9): Low Risk  (05/07/2023)  Social Connections: Unknown (07/10/2021)   Received from [facility]  Tobacco Use: Low Risk  (04/02/2023)    Chaplain and patient discussed common feelings and emotions when being diagnosed with cancer, and the importance of support during treatment.  Chaplain informed patient of the support team and support services at [facility].  Chaplain provided contact information and encouraged patient to call with any questions or concerns.   Provided compassionate presence, empathic listening, emotional support, and normalization of feelings. Support programming and healing garden are of interest to [name] [name].   Follow up needed: Yes.   We plan to follow up by phone in ca two weeks for check-in, including revisiting her interest in an Sports coach and joining the support programming mailing list.   Konrad Dolores, [facility] Pager [phone] Voicemail [phone]

## 2023-05-07 NOTE — Progress Notes (Signed)
 REFERRING PROVIDER: [facility] [address] [place],  [place] [address]  PRIMARY PROVIDER:  Pa, [facility]  PRIMARY REASON FOR VISIT:  1. Ductal carcinoma in situ (DCIS) of right breast    HISTORY OF PRESENT ILLNESS:   Ms. Mcclurkin, a 58 y.o. female, was seen for a [place] cancer genetics consultation as part of her evaluation with the Breast [facility]idisciplinary [facility].  Ms. Coletta presents to clinic today to discuss the possibility of a hereditary predisposition to cancer, genetic testing, and to further clarify her future cancer risks, as well as potential cancer risks for family members.   In 2025, at the age of 77, Ms. 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    CANCER HISTORY:  Oncology History  Ductal carcinoma in situ (DCIS) of right breast  05/01/2023 Initial Diagnosis   Screening mammogram detected right breast asymmetry which resolved, bilateral calcifications Right calcifications 2.3 cm: Anterior and posterior biopsies: High-grade DCIS ER 0%, PR 0% Left calcifications 2 groups: Stereotactic biopsy on 05/08/2023   05/07/2023 Cancer Staging   Staging form: Breast, AJCC 8th Edition - Clinical: Stage 0 (cTis (DCIS), cN0, cM0, G3, ER-, PR-, HER2: Not Assessed) - Signed by Serena Croissant, MD on 05/07/2023 Histologic grading system: 3 grade system      RISK FACTORS:  Menarche was at age 14.  First live birth at age 19.  Menopausal status: postmenopausal.  HRT use: 0 years. Colonoscopy: yes; normal. Mammogram within the last year: yes.   Past Medical History:  Diagnosis Date   Allergy    Anemia    Arthritis    back   Breast cancer (HCC)    Hematuria    Hypertension     Past Surgical History:  Procedure Laterality Date   BREAST BIOPSY Right 05/01/2023   MM RT BREAST BX W LOC DEV 1ST LESION IMAGE BX SPEC STEREO GUIDE 05/01/2023 [facility]   BREAST BIOPSY Right 05/01/2023   MM RT BREAST BX W LOC DEV EA AD LESION IMG BX  SPEC STEREO GUIDE 05/01/2023 [facility]   TUBAL LIGATION  1994    Social History   Socioeconomic History   Marital status: Significant Other    Spouse name: Not on file   Number of children: 2   Years of education: Not on file   Highest education level: Not on file  Occupational History    Employer: [employer]   Tobacco Use   Smoking status: Never   Smokeless tobacco: Never  Substance and Sexual Activity   Alcohol use: Yes    Alcohol/week: 10.0 standard drinks of alcohol    Types: 10 Standard drinks or equivalent per week    Comment: 2-3 glass wine/day,    Drug use: No   Sexual activity: Not on file  Other Topics Concern   Not on file  Social History Narrative   REGULAR EXERCISE:  3 X WEEKLY   CAFFEINE USE:  NO   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   Divorced   2 children (age 17 and 29)            Social Drivers of Corporate investment banker Strain: Not on file  Food Insecurity: No Food Insecurity (05/07/2023)   Hunger Vital Sign    Worried About Running Out of Food in the Last Year: Never true    Ran Out of Food in the Last Year: Never true  Transportation Needs: No Transportation Needs (  05/07/2023)   PRAPARE - Administrator, Civil Service (Medical): No    Lack of Transportation (Non-Medical): No  Physical Activity: Not on file  Stress: Not on file  Social Connections: Unknown (07/10/2021)   Received from [facility], [facility]   Social Network    Social Network: Not on file     FAMILY HISTORY:  We obtained a detailed, 4-generation family history.  Significant diagnoses are listed below: Family History  Problem Relation Age of Onset   Hypertension Mother    Heart attack Father    Diabetes Sister    Hyperlipidemia Sister    Hypertension Brother    Urolithiasis Brother    Lung cancer Brother    Lung cancer Maternal Uncle    Cancer Neg Hx    Kidney disease Neg Hx    Stroke Neg Hx    Colon cancer Neg Hx     Colon polyps Neg Hx    Esophageal cancer Neg Hx    Stomach cancer Neg Hx    Rectal cancer Neg Hx     Ms. Flannigan is unaware of previous family history of genetic testing for hereditary cancer risks. There is no reported Ashkenazi Jewish ancestry. Ms. Broyles reports her brother, age 53, was diagnosed with multiple myeloma in his early 2s. She reports her brother, age 66, was diagnosed with lung cancer, history of cigarette use. She reports a maternal uncle diagnosed with cancer, possibly lung, living in his 78s. She does not report any additional family history of tumors or cancers, but did share that information was limited regarding health history of extended paternal relatives.    GENETIC COUNSELING ASSESSMENT: Ms. Claus is a 58 y.o. female with a personal history of cancer which is somewhat suggestive of an inherited predisposition to cancer. We, therefore, discussed and recommended the following at today's visit.   DISCUSSION: We discussed that, in general, most cancer is not inherited in families, but instead is sporadic or familial. Sporadic cancers occur by chance and typically happen at older ages (>50 years) as this type of cancer is caused by genetic changes acquired during an individual's lifetime. Some families have more cancers than would be expected by chance; however, the ages or types of cancer are not consistent with a known genetic mutation or known genetic mutations have been ruled out. This type of familial cancer is thought to be due to a combination of multiple genetic, environmental, hormonal, and lifestyle factors. While this combination of factors likely increases the risk of cancer, the exact source of this risk is not currently identifiable or testable.  We discussed that 5 - 10% of cancer is hereditary. We discussed that testing is beneficial for several reasons including knowing how to follow individuals after completing their treatment, identifying treatment options  (such as the type of surgery) and to understand if other family members could be at risk for cancer and allow them to undergo genetic testing.   We reviewed the characteristics, features and inheritance patterns of hereditary cancer syndromes. We also discussed genetic testing, including the appropriate family members to test, the process of testing, insurance coverage and turn-around-time for results. We discussed the implications of a negative, positive, carrier and/or variant of uncertain significant result. Ms. Winslett  was offered a breast cancer STAT panel and an expanded pan-cancer panel (70+ genes). Ms. Delancey was informed of the benefits and limitations of each panel, including that expanded pan-cancer panels contain genes that do not have clear management  guidelines at this point in time.  We also discussed that as the number of genes included on a panel increases, the chances of variants of uncertain significance increases. Ms. Dohn decided to pursue genetic testing for the breast cancer stat and 76 gene panel. The CancerNext-Expanded gene panel offered by [facility] and includes sequencing, rearrangement, and RNA analysis for the following 76 genes: AIP, ALK, APC, ATM, AXIN2, BAP1, BARD1, BMPR1A, BRCA1, BRCA2, BRIP1, CDC73, CDH1, CDK4, CDKN1B, CDKN2A, CEBPA, CHEK2, CTNNA1, DDX41, DICER1, ETV6, FH, FLCN, GATA2, LZTR1, MAX, MBD4, MEN1, MET, MLH1, MSH2, MSH3, MSH6, MUTYH, NF1, NF2, NTHL1, PALB2, PHOX2B, PMS2, POT1, PRKAR1A, PTCH1, PTEN, RAD51C, RAD51D, RB1, RET, RUNX1, SDHA, SDHAF2, SDHB, SDHC, SDHD, SMAD4, SMARCA4, SMARCB1, SMARCE1, STK11, SUFU, TMEM127, TP53, TSC1, TSC2, VHL, and WT1 (sequencing and deletion/duplication); EGFR, HOXB13, KIT, MITF, PDGFRA, POLD1, and POLE (sequencing only); EPCAM and GREM1 (deletion/duplication only).    Based on Ms. Nightengale's personal history of cancer, she meets medical criteria for genetic testing. Despite that she meets criteria, she may still have an out of  pocket cost. We discussed that if her out of pocket cost for testing is over $100, the laboratory will call and confirm whether she wants to proceed with testing.  If the out of pocket cost of testing is less than $100 she will be billed by the genetic testing laboratory.   We discussed that some people do not want to undergo genetic testing due to fear of genetic discrimination.  The Genetic Information Nondiscrimination Act (GINA) was signed into federal law in 2008. GINA prohibits health insurers and most employers from discriminating against individuals based on genetic information (including the results of genetic tests and family history information). According to GINA, health insurance companies cannot consider genetic information to be a preexisting condition, nor can they use it to make decisions regarding coverage or rates. GINA also makes it illegal for most employers to use genetic information in making decisions about hiring, firing, promotion, or terms of employment. It is important to note that GINA does not offer protections for life insurance, disability insurance, or long-term care insurance. GINA does not apply to those in the [employer], those who work for companies with less than 15 employees, and new life insurance or long-term disability insurance policies.  Health status due to a cancer diagnosis is not protected under GINA. More information about GINA can be found by visiting [email or web address].  We reviewed the characteristics, features and inheritance patterns of hereditary cancer syndromes. We also discussed genetic testing, including the appropriate family members to test, the process of testing, insurance coverage and turn-around-time for results. We discussed the implications of a negative, positive and/or variant of uncertain significant result. In order to get genetic test results in a timely manner so that Ms. Weberg can use these genetic test results for surgical decisions, we  recommended Ms. Choate pursue genetic testing for the the 13 gene BRCAPlus panel. Once complete, we recommend Ms. Knittle pursue reflex genetic testing to the 76 gene CancerNext-Expanded +RNAinsight gene panel.   PLAN: After considering the risks, benefits, and limitations, Ms. Caracci provided informed consent to pursue genetic testing and the blood sample was sent to [facility] for analysis of the BRCAPlus and CancerNext-Expanded +RNAinsight tests. Results should be available within approximately 7-10 days for the STAT result and 2-3 weeks' time for the full panel, at which point they will be disclosed by telephone to Ms. Fontes, as will any additional recommendations warranted by these results. Ms. Parmenter  will receive a summary of her genetic counseling visit and a copy of her results once available. This information will also be available in Epic.   Lastly, we encouraged Ms. Kiper to remain in contact with cancer genetics annually so that we can continuously update the family history and inform her of any changes in cancer genetics and testing that may be of benefit for this family.   Ms. Terhaar questions were answered to her satisfaction today. Our contact information was provided should additional questions or concerns arise. Thank you for the referral and allowing [place] to share in the care of your patient.   Vassie Moment, MS, [facility] Licensed, [email or web address] phone: [phone]  25 minutes were spent on the date of the encounter in service to the patient including preparation, face-to-face consultation, documentation and care coordination.  The patient brought her friend, Dorinda Hill, to the appointment. Drs. Meliton Rattan, and/or Ezel were available for questions, if needed.  _______________________________________________________________________ For Office Staff:  Number of people involved in session: 2 Was an Intern/ student involved with  case: no

## 2023-05-07 NOTE — Progress Notes (Signed)
 [facility] CONSULT NOTE  Patient Care Team: Pa, [facility] as PCP - General Mezer, Dimas Aguas, MD as Consulting Physician (Gynecology) Pershing Proud, RN as Oncology Nurse Navigator Donnelly Angelica, RN as Oncology Nurse Navigator Serena Croissant, MD as Consulting Physician (Hematology and Oncology) Antony Blackbird, MD as Consulting Physician (Radiation Oncology) Griselda Miner, MD as Consulting Physician (General Surgery)  CHIEF COMPLAINTS/PURPOSE OF CONSULTATION:  Newly diagnosed breast cancer  HISTORY OF PRESENTING ILLNESS:  [name] is a 58 year old who had a screening mammogram detected right breast asymmetry which resolved on further evaluation.  She had bilateral calcifications.  In the right breast there was a 2.7 cm area of calcifications and she underwent 2 biopsies in the anterior and posterior extent it came back as high-grade DCIS ER 0%, PR 0%.  Left breast calcifications 2 groups were identified and she is scheduled for a stereotactic biopsy on 05/08/2023.  The report says that these are benign appearing.  Patient was presented this morning at the multidisciplinary tumor board and she is here today accompanied by her family to discuss her treatment plan.   I reviewed her records extensively and collaborated the history with the patient.  SUMMARY OF ONCOLOGIC HISTORY: Oncology History  Ductal carcinoma in situ (DCIS) of right breast  05/01/2023 Initial Diagnosis   Screening mammogram detected right breast asymmetry which resolved, bilateral calcifications Right calcifications 2.3 cm: Anterior and posterior biopsies: High-grade DCIS ER 0%, PR 0% Left calcifications 2 groups: Stereotactic biopsy on 05/08/2023   05/07/2023 Cancer Staging   Staging form: Breast, AJCC 8th Edition - Clinical: Stage 0 (cTis (DCIS), cN0, cM0, G3, ER-, PR-, HER2: Not Assessed) - Signed by Serena Croissant, MD on 05/07/2023 Histologic grading system: 3 grade system       MEDICAL HISTORY:  Past Medical History:  Diagnosis Date   Allergy    Anemia    Arthritis    back   Hematuria    Hypertension     SURGICAL HISTORY: Past Surgical History:  Procedure Laterality Date   BREAST BIOPSY Right 05/01/2023   MM RT BREAST BX W LOC DEV 1ST LESION IMAGE BX SPEC STEREO GUIDE 05/01/2023 [facility]   BREAST BIOPSY Right 05/01/2023   MM RT BREAST BX W LOC DEV EA AD LESION IMG BX SPEC STEREO GUIDE 05/01/2023 [facility]   TUBAL LIGATION  1994    SOCIAL HISTORY: Social History   Socioeconomic History   Marital status: Significant Other    Spouse name: Not on file   Number of children: 2   Years of education: Not on file   Highest education level: Not on file  Occupational History    Employer: [employer]   Tobacco Use   Smoking status: Never   Smokeless tobacco: Never  Substance and Sexual Activity   Alcohol use: Yes    Alcohol/week: 10.0 standard drinks of alcohol    Types: 10 Standard drinks or equivalent per week    Comment: 2-3 glass wine/day,    Drug use: No   Sexual activity: Not on file  Other Topics Concern   Not on file  Social History Narrative   REGULAR EXERCISE:  3 X WEEKLY   CAFFEINE USE:  NO   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   Divorced   2 children (age 52 and 21)            Social Drivers of Corporate investment banker Strain: Not  on file  Food Insecurity: Not on file  Transportation Needs: Not on file  Physical Activity: Not on file  Stress: Not on file  Social Connections: Unknown (07/10/2021)   Received from [facility]   Social Network    Social Network: Not on file  Intimate Partner Violence: Unknown (06/01/2021)   Received from [facility], [facility]   HITS    Physically Hurt: Not on file    Insult or Talk Down To: Not on file    Threaten Physical Harm: Not on file    Scream or Curse: Not on file    FAMILY HISTORY: Family History  Problem  Relation Age of Onset   Hypertension Mother    Heart attack Father    Diabetes Sister    Hypertension Brother    Hyperlipidemia Sister    Urolithiasis Brother    Cancer Neg Hx    Kidney disease Neg Hx    Stroke Neg Hx    Colon cancer Neg Hx    Colon polyps Neg Hx    Esophageal cancer Neg Hx    Stomach cancer Neg Hx    Rectal cancer Neg Hx     ALLERGIES:  has no known allergies.  MEDICATIONS:  Current Outpatient Medications  Medication Sig Dispense Refill   amLODipine (NORVASC) 5 MG tablet TAKE 1 TABLET(5 MG) BY MOUTH DAILY 30 tablet 0   baclofen (LIORESAL) 10 MG tablet Take 1 tablet (10 mg total) by mouth 3 (three) times daily. 30 each 0   famotidine (PEPCID) 20 MG tablet Take 1 tablet (20 mg total) by mouth at bedtime. 30 tablet 0   Guaifenesin 1200 MG TB12 Take 1 tablet (1,200 mg total) by mouth in the morning and at bedtime. 14 tablet 0   metFORMIN (GLUCOPHAGE) 500 MG tablet Take 500 mg by mouth 2 (two) times daily.     Multiple Vitamin (MULTIVITAMIN) tablet Take 1 tablet by mouth daily.     omeprazole (PRILOSEC) 20 MG capsule Take 1 capsule (20 mg total) by mouth daily with breakfast. 30 capsule 0   promethazine-dextromethorphan (PROMETHAZINE-DM) 6.25-15 MG/5ML syrup Take 5 mLs by mouth at bedtime as needed for cough. 118 mL 0   VITAMIN D, ERGOCALCIFEROL, PO Take 25 mcg by mouth daily.     Current Facility-Administered Medications  Medication Dose Route Frequency Provider Last Rate Last Admin   0.9 %  sodium chloride infusion  500 mL Intravenous Once Charlie Pitter III, MD        REVIEW OF SYSTEMS:   Constitutional: Denies fevers, chills or abnormal night sweats Breast:  Denies any palpable lumps or discharge All other systems were reviewed with the patient and are negative.  PHYSICAL EXAMINATION: ECOG PERFORMANCE STATUS: 0 - Asymptomatic  Vitals:   05/07/23 1308  BP: (!) 140/61  Pulse: (!) 59  Resp: 18  Temp: 98 F (36.7 C)  SpO2: 100%   Filed Weights    05/07/23 1308  Weight: 164 lb 14.4 oz (74.8 kg)    GENERAL:alert, no distress and comfortable    LABORATORY DATA:  I have reviewed the data as listed Lab Results  Component Value Date   WBC 7.9 05/07/2023   HGB 10.9 (L) 05/07/2023   HCT 34.5 (L) 05/07/2023   MCV 88.9 05/07/2023   PLT 327 05/07/2023   Lab Results  Component Value Date   NA 139 05/07/2023   K 4.2 05/07/2023   CL 105 05/07/2023   CO2 30 05/07/2023  RADIOGRAPHIC STUDIES: I have personally reviewed the radiological reports and agreed with the findings in the report.  ASSESSMENT AND PLAN:  Ductal carcinoma in situ (DCIS) of right breast 05/01/2023:Screening mammogram detected right breast asymmetry which resolved, bilateral calcifications Right calcifications 2.3 cm: Anterior and posterior biopsies: High-grade DCIS ER 0%, PR 0% Left calcifications 2 groups: Stereotactic biopsy on 05/08/2023  Pathology review: I discussed with the patient the difference between DCIS and invasive breast cancer. It is considered a precancerous lesion. DCIS is classified as a 0. It is generally detected through mammograms as calcifications. We discussed the significance of grades and its impact on prognosis. We also discussed the importance of ER and PR receptors and their implications to adjuvant treatment options. Prognosis of DCIS dependence on grade, comedo necrosis. It is anticipated that if not treated, 20-30% of DCIS can develop into invasive breast cancer.  Recommendation: 1. Breast conserving surgery 2. Followed by adjuvant radiation therapy 3.  No role of antiestrogen therapy since the ER/PR are 0%.  (We will await the results of the left breast biopsy to determine adjuvant treatment plan for the left breast calcifications)  Return to clinic after surgery to discuss results     All questions were answered. The patient knows to call the clinic with any problems, questions or concerns.    Tamsen Meek, MD 05/07/23

## 2023-05-07 NOTE — Assessment & Plan Note (Signed)
 05/01/2023:Screening mammogram detected right breast asymmetry which resolved, bilateral calcifications Right calcifications 2.3 cm: Anterior and posterior biopsies: High-grade DCIS ER 0%, PR 0% Left calcifications 2 groups: Stereotactic biopsy on 05/08/2023  Pathology review: I discussed with the patient the difference between DCIS and invasive breast cancer. It is considered a precancerous lesion. DCIS is classified as a 0. It is generally detected through mammograms as calcifications. We discussed the significance of grades and its impact on prognosis. We also discussed the importance of ER and PR receptors and their implications to adjuvant treatment options. Prognosis of DCIS dependence on grade, comedo necrosis. It is anticipated that if not treated, 20-30% of DCIS can develop into invasive breast cancer.  Recommendation: 1. Breast conserving surgery 2. Followed by adjuvant radiation therapy 3.  No role of antiestrogen therapy since the ER/PR are 0%.  (We will await the results of the left breast biopsy to determine adjuvant treatment plan for the left breast calcifications)  Return to clinic after surgery to discuss results

## 2023-05-08 ENCOUNTER — Ambulatory Visit
Admission: RE | Admit: 2023-05-08 | Discharge: 2023-05-08 | Disposition: A | Discharge status: Home / Self Care | Source: Ambulatory Visit | Attending: Obstetrics and Gynecology | Admitting: Obstetrics and Gynecology

## 2023-05-08 DIAGNOSIS — R928 Other abnormal and inconclusive findings on diagnostic imaging of breast: Secondary | ICD-10-CM

## 2023-05-08 DIAGNOSIS — R921 Mammographic calcification found on diagnostic imaging of breast: Secondary | ICD-10-CM

## 2023-05-08 HISTORY — PX: BREAST BIOPSY: SHX20

## 2023-05-09 LAB — SURGICAL PATHOLOGY

## 2023-05-13 ENCOUNTER — Ambulatory Visit: Payer: Self-pay | Admitting: General Surgery

## 2023-05-13 DIAGNOSIS — D0511 Intraductal carcinoma in situ of right breast: Secondary | ICD-10-CM

## 2023-05-13 MED ORDER — KETOROLAC TROMETHAMINE 15 MG/ML IJ SOLN: 15.0000 mg | INTRAMUSCULAR | Status: AC

## 2023-05-14 ENCOUNTER — Telehealth: Payer: Self-pay | Admitting: *Deleted

## 2023-05-14 ENCOUNTER — Encounter: Payer: Self-pay | Admitting: *Deleted

## 2023-05-14 NOTE — Telephone Encounter (Signed)
 Spoke with patient to follow up from [facility] 3/12 and assess navigation needs. Patient denies any questions or concerns at this time. Encouraged her to call should anything arise. Patient verbalized understanding.

## 2023-05-15 ENCOUNTER — Other Ambulatory Visit: Payer: Self-pay | Admitting: General Surgery

## 2023-05-15 DIAGNOSIS — D0511 Intraductal carcinoma in situ of right breast: Secondary | ICD-10-CM

## 2023-05-16 ENCOUNTER — Other Ambulatory Visit: Payer: Self-pay

## 2023-05-16 ENCOUNTER — Encounter (HOSPITAL_BASED_OUTPATIENT_CLINIC_OR_DEPARTMENT_OTHER): Payer: Self-pay | Admitting: General Surgery

## 2023-05-19 ENCOUNTER — Encounter: Payer: Self-pay | Admitting: *Deleted

## 2023-05-19 DIAGNOSIS — D0511 Intraductal carcinoma in situ of right breast: Secondary | ICD-10-CM

## 2023-05-20 ENCOUNTER — Encounter (HOSPITAL_BASED_OUTPATIENT_CLINIC_OR_DEPARTMENT_OTHER)
Admission: RE | Admit: 2023-05-20 | Discharge: 2023-05-20 | Disposition: A | Discharge status: Home / Self Care | Source: Ambulatory Visit | Attending: General Surgery | Admitting: General Surgery

## 2023-05-20 DIAGNOSIS — Z01812 Encounter for preprocedural laboratory examination: Secondary | ICD-10-CM | POA: Insufficient documentation

## 2023-05-20 NOTE — Progress Notes (Signed)
 EKG- Left bundle branch block, Dr. Maple Hudson reviewed, will need to have cardiac clearance prior to seed placement and surgery. Spoke with Joni Reining at Dr. Billey Chang office. Patient aware.

## 2023-05-20 NOTE — Progress Notes (Signed)

## 2023-05-21 ENCOUNTER — Encounter: Payer: Self-pay | Admitting: General Practice

## 2023-05-21 NOTE — Progress Notes (Signed)
 [facility] Spiritual Care Note  Attempted [facility] follow-up call to revisit potential interest in an Sports coach and/or being added to [employer] (Patient and Family Support) programming mailing list. Left voicemail with direct number and encouragement to return call.   [address] [place], [place], [facility] Pager [phone] Voicemail [phone]

## 2023-05-22 ENCOUNTER — Encounter

## 2023-05-23 ENCOUNTER — Ambulatory Visit: Attending: Cardiovascular Disease | Admitting: Cardiovascular Disease

## 2023-05-23 ENCOUNTER — Ambulatory Visit: Payer: Self-pay | Admitting: Genetic Counselor

## 2023-05-23 ENCOUNTER — Telehealth: Payer: Self-pay | Admitting: Genetic Counselor

## 2023-05-23 ENCOUNTER — Encounter: Payer: Self-pay | Admitting: Cardiovascular Disease

## 2023-05-23 VITALS — BP 133/77 | HR 54 | Ht 65.0 in | Wt 166.8 lb

## 2023-05-23 DIAGNOSIS — I10 Essential (primary) hypertension: Secondary | ICD-10-CM | POA: Diagnosis not present

## 2023-05-23 DIAGNOSIS — I447 Left bundle-branch block, unspecified: Secondary | ICD-10-CM

## 2023-05-23 DIAGNOSIS — Z1379 Encounter for other screening for genetic and chromosomal anomalies: Secondary | ICD-10-CM | POA: Insufficient documentation

## 2023-05-23 DIAGNOSIS — Z01810 Encounter for preprocedural cardiovascular examination: Secondary | ICD-10-CM

## 2023-05-23 NOTE — Progress Notes (Signed)
 HPI:  [name] [name] was previously seen in the [facility] clinic due to a personal and family history of cancer and concerns regarding a hereditary predisposition to cancer. Please refer to our prior cancer genetics clinic note for more information regarding our discussion, assessment and recommendations, at the time. [name] [name] recent genetic test results were disclosed to her, as were recommendations warranted by these results. These results and recommendations are discussed in more detail below.  CANCER HISTORY:  Oncology History  Ductal carcinoma in situ (DCIS) of right breast  05/01/2023 Initial Diagnosis   Screening mammogram detected right breast asymmetry which resolved, bilateral calcifications Right calcifications 2.3 cm: Anterior and posterior biopsies: High-grade DCIS ER 0%, PR 0% Left calcifications 2 groups: Stereotactic biopsy on 05/08/2023   05/07/2023 Cancer Staging   Staging form: Breast, AJCC 8th Edition - Clinical: Stage 0 (cTis (DCIS), cN0, cM0, G3, ER-, PR-, HER2: Not Assessed) - Signed by Serena Croissant, MD on 05/07/2023 Histologic grading system: 3 grade system    Genetic Testing   Non-diagnostic [facility] CancerNext-Expanded +RNAinsight panel. VUS in BRIP1, c.3196delT and DICER1, p.C1641W. The CancerNext-Expanded gene panel offered by [facility] and includes sequencing, rearrangement, and RNA analysis for the following 76 genes: AIP, ALK, APC, ATM, AXIN2, BAP1, BARD1, BMPR1A, BRCA1, BRCA2, BRIP1, CDC73, CDH1, CDK4, CDKN1B, CDKN2A, CEBPA, CHEK2, CTNNA1, DDX41, DICER1, ETV6, FH, FLCN, GATA2, LZTR1, MAX, MBD4, MEN1, MET, MLH1, MSH2, MSH3, MSH6, MUTYH, NF1, NF2, NTHL1, PALB2, PHOX2B, PMS2, POT1, PRKAR1A, PTCH1, PTEN, RAD51C, RAD51D, RB1, RET, RUNX1, SDHA, SDHAF2, SDHB, SDHC, SDHD, SMAD4, SMARCA4, SMARCB1, SMARCE1, STK11, SUFU, TMEM127, TP53, TSC1, TSC2, VHL, and WT1 (sequencing and deletion/duplication); EGFR, HOXB13, KIT, MITF, PDGFRA, POLD1, and POLE (sequencing only); EPCAM and GREM1  (deletion/duplication only). Report date 05/19/23.      FAMILY HISTORY:  We obtained a detailed, 4-generation family history.  Significant diagnoses are listed below: Family History  Problem Relation Age of Onset   Hypertension Mother    Heart attack Father    Diabetes Sister    Hyperlipidemia Sister    Hypertension Brother    Urolithiasis Brother    Lung cancer Brother    Lung cancer Maternal Uncle    Cancer Neg Hx    Kidney disease Neg Hx    Stroke Neg Hx    Colon cancer Neg Hx    Colon polyps Neg Hx    Esophageal cancer Neg Hx    Stomach cancer Neg Hx    Rectal cancer Neg Hx     Ms. Goll is unaware of previous family history of genetic testing for hereditary cancer risks. There is no reported Ashkenazi Jewish ancestry. Ms. Himmelberger reports her brother, age 8, was diagnosed with multiple myeloma in his early 66s. She reports her brother, age 98, was diagnosed with lung cancer, history of cigarette use. She reports a maternal uncle diagnosed with cancer, possibly lung, living in his 45s. She does not report any additional family history of tumors or cancers, but did share that information was limited regarding health history of extended paternal relatives.      GENETIC TEST RESULTS: Genetic testing reported out on 05/19/23 through the [facility] CancerNext-Expanded +RNAinsight panel found no pathogenic mutations. The CancerNext-Expanded gene panel offered by [facility] and includes sequencing, rearrangement, and RNA analysis for the following 76 genes: AIP, ALK, APC, ATM, AXIN2, BAP1, BARD1, BMPR1A, BRCA1, BRCA2, BRIP1, CDC73, CDH1, CDK4, CDKN1B, CDKN2A, CEBPA, CHEK2, CTNNA1, DDX41, DICER1, ETV6, FH, FLCN, GATA2, LZTR1, MAX, MBD4, MEN1, MET, MLH1, MSH2, MSH3, MSH6,  MUTYH, NF1, NF2, NTHL1, PALB2, PHOX2B, PMS2, POT1, PRKAR1A, PTCH1, PTEN, RAD51C, RAD51D, RB1, RET, RUNX1, SDHA, SDHAF2, SDHB, SDHC, SDHD, SMAD4, SMARCA4, SMARCB1, SMARCE1, STK11, SUFU, TMEM127, TP53, TSC1, TSC2, VHL, and WT1  (sequencing and deletion/duplication); EGFR, HOXB13, KIT, MITF, PDGFRA, POLD1, and POLE (sequencing only); EPCAM and GREM1 (deletion/duplication only). The test report has been scanned into EPIC and is located under the Molecular Pathology section of the Results Review tab.  A portion of the result report is included below for reference.     We discussed with Ms. Ginzburg that because current genetic testing is not perfect, it is possible there may be a gene mutation in one of these genes that current testing cannot detect, but that chance is small.  We also discussed, that there could be another gene that has not yet been discovered, or that we have not yet tested, that is responsible for the cancer diagnoses in the family. It is also possible there is a hereditary cause for the cancer in the family that Ms. Clipper did not inherit and therefore was not identified in her testing.  Therefore, it is important to remain in touch with cancer genetics in the future so that we can continue to offer Ms. Brazill the most up to date genetic testing.   Genetic testing did identify two Variants of uncertain significance (VUS) - one in the BRIP1 gene called c.3196delT ([id number]), a second in the DICER1 gene called p.C1641W (c.4923T>G).  At this time, it is unknown if these variants are associated with increased cancer risk or if they are normal findings, but most variants such as these get reclassified to being inconsequential. They should not be used to make medical management decisions. With time, we suspect the lab will determine the significance of these variants, if any. If we do learn more about them, we will try to contact Ms. Nunziata to discuss it further. However, it is important to stay in touch with [place] periodically and keep the address and phone number up to date.  It is important to note that with the BRIP1,  c.3196delT ([id number]) variant there are other labs that still classify this variant as likely  pathogenic, meaning they believe it does impact gene function and may increase a woman's risk to develop ovarian cancer. [facility] previously called this variant likely pathogenic, but recently downgraded it to be a variant of unknown significance due to internal data. Per [facility]:   In discussion with [facility] genetic counselors, they shared that this variant does have some evidence for pathogenicity but also has been seen in functional studies to show that variants in the 3' region of the gene (such as Ms. Susi') do not impact gene function. Additionally, variants in the 3' region were recently identified in a couple of individuals who had biallelic BRIP1 variants but did not have features of Fanconi anemia, which would be expected if someone where to have biallelic pathogenic BRIP1 mutations. We have reached out to other labs to get more clarity on their current classifications for this variant and will follow up with Ms. Ewald to discuss any additional information obtained. At this time, the significance of the BRIP1,  c.3196delT ([id number]) variant is unclear.    ADDITIONAL GENETIC TESTING: We discussed with Ms. Lorson that her genetic testing was fairly extensive.  If there are genes identified to increase cancer risk that can be analyzed in the future, we would be happy to discuss and coordinate this testing at that time.    CANCER  SCREENING RECOMMENDATIONS: Ms. Tailor test result is considered negative (normal).  This means that we have not identified a hereditary cause for her personal and family history of cancer at this time. Most cancers happen by chance and this negative test suggests that her personal and family history of cancer may fall into this category.    Possible reasons for Ms. Padget's negative genetic test include:  1. There may be a gene mutation in one of these genes that current testing methods cannot detect but that chance is small.  2. There could be another gene that has not  yet been discovered, or that we have not yet tested, that is responsible for the cancer diagnoses in the family.  3.  There may be no hereditary risk for cancer in the family. The cancers in Ms. Erpelding and/or her family may be sporadic/familial or due to other genetic and environmental factors. 4. It is also possible there is a hereditary cause for the cancer in the family that Ms. Alarie did not inherit.  Therefore, it is recommended she continue to follow the cancer management and screening guidelines provided by her oncology and primary healthcare provider. An individual's cancer risk and medical management are not determined by genetic test results alone. Overall cancer risk assessment incorporates additional factors, including personal medical history, family history, and any available genetic information that may result in a personalized plan for cancer prevention and surveillance  An individual's cancer risk and medical management are not determined by genetic test results alone. Overall cancer risk assessment incorporates additional factors, including personal medical history, family history, and any available genetic information that may result in a personalized plan for cancer prevention and surveillance.  RECOMMENDATIONS FOR FAMILY MEMBERS:  Individuals in this family might be at some increased risk of developing cancer, over the general population risk, simply due to the family history of cancer.  We recommended women in this family have a yearly mammogram beginning at age 75, or 5 years younger than the earliest onset of cancer, an annual clinical breast exam, and perform monthly breast self-exams. Women in this family should also have a gynecological exam as recommended by their primary provider. All family members should be referred for colonoscopy starting at age 79, or 22 years younger than the earliest onset of cancer.   FOLLOW-UP: Lastly, we discussed with Ms. Taliercio that cancer  genetics is a rapidly advancing field and it is possible that new genetic tests will be appropriate for her and/or her family members in the future. We encouraged her to remain in contact with cancer genetics on an annual basis so we can update her personal and family histories and let her know of advances in cancer genetics that may benefit this family.   Our contact number was provided. Ms. Buresh questions were answered to her satisfaction, and she knows she is welcome to call us at anytime with additional questions or concerns.   Vassie Moment, MS, [facility] Licensed, Retail [email or web address] [phone]

## 2023-05-23 NOTE — Patient Instructions (Signed)
 Medication Instructions:  No changes *If you need a refill on your cardiac medications before your next appointment, please call your pharmacy*  Testing/Procedures: Your physician has requested that you have an echocardiogram. Echocardiography is a painless test that uses sound waves to create images of your heart. It provides your doctor with information about the size and shape of your heart and how well your heart's chambers and valves are working. This procedure takes approximately one hour. There are no restrictions for this procedure. Please do NOT wear cologne, perfume, aftershave, or lotions (deodorant is allowed). Please arrive 15 minutes prior to your appointment time.  Please note: We ask at that you not bring children with you during ultrasound (echo/ vascular) testing. Due to room size and safety concerns, children are not allowed in the ultrasound rooms during exams. Our front office staff cannot provide observation of children in our lobby area while testing is being conducted. An adult accompanying a patient to their appointment will only be allowed in the ultrasound room at the discretion of the ultrasound technician under special circumstances. We apologize for any inconvenience.   Follow-Up: At [facility], you and your health needs are our priority.  As part of our continuing mission to provide you with exceptional heart care, our providers are all part of one team.  This team includes your primary Cardiologist (physician) and Advanced Practice Providers or APPs (Physician Assistants and Nurse Practitioners) who all work together to provide you with the care you need, when you need it.  Your next appointment:   1 year(s)  Provider:   Thurmon Fair, MD     We recommend signing up for the patient portal called "MyChart".  Sign up information is provided on this After Visit Summary.  MyChart is used to connect with patients for Virtual Visits (Telemedicine).  Patients  are able to view lab/test results, encounter notes, upcoming appointments, etc.  Non-urgent messages can be sent to your provider as well.   To learn more about what you can do with MyChart, go to [email or web address].         1st Floor: - Lobby - Registration  - Pharmacy  - Lab - Cafe  2nd Floor: - PV Lab - Diagnostic Testing (echo, CT, nuclear med)  3rd Floor: - Vacant  4th Floor: - TCTS (cardiothoracic surgery) - AFib Clinic - Structural Heart Clinic - Vascular Surgery  - Vascular Ultrasound  5th Floor: - HeartCare Cardiology (general and EP) - Clinical Pharmacy for coumadin, hypertension, lipid, weight-loss medications, and med management appointments    Valet parking services will be available as well.

## 2023-05-23 NOTE — Progress Notes (Signed)
 Cardiology Office Note:    Date:  05/23/2023   ID:  [name] [name], DOB 1966-02-12, MRN [id number]  PCP:  [facility]   [facility] Providers Cardiologist:  None     Referring MD: Griselda Miner, MD   Chief Complaint  Patient presents with   Pre-op Exam  [name] [name] is a 58 y.o. female who is being seen today for the evaluation of abnormal electrocardiogram at the request of Griselda Miner, MD.   History of Present Illness:    [name] [name] is a 58 y.o. female with hypertension and type 2 diabetes who presents for evaluation of a newly discovered left bundle branch block. She was referred by her surgical team for evaluation of a newly discovered left bundle branch block found during preoperative EKG.  A left bundle branch block was discovered on a preoperative EKG while preparing for breast cancer surgery. She has no prior history of heart problems and has not experienced any symptoms suggestive of heart disease. Her calcium score is zero, indicating no visible calcified plaque in her coronary arteries. No significant heart-related symptoms such as shortness of breath or signs of congestive heart failure are present.  She experienced a single episode of syncope in January 2025 while on a flight. She felt hot and flushed before passing out, attributing it to being cramped and possibly dehydrated. This is the only time she has fainted, and she denies any other episodes of syncope or symptoms such as queasiness during blood draws. She reports a family history of fainting, as her son has experienced it once in the past.  She has a history of hypertension for which she has been on medication for over five years. Hypertension runs in her family. No significant heart-related symptoms are reported.  She has diabetes, which is currently well-controlled with a hemoglobin A1c of 6.1%, indicating prediabetes range. She has lost weight recently, contributing to the  improvement in her blood sugar levels.  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   Diabetes mellitus without complication (HCC)    Hematuria    Hypertension     Past Surgical History:  Procedure Laterality Date   BREAST BIOPSY Right 05/01/2023   MM RT BREAST BX W LOC DEV 1ST LESION IMAGE BX SPEC STEREO GUIDE 05/01/2023 [facility]   BREAST BIOPSY Right 05/01/2023   MM RT BREAST BX W LOC DEV EA AD LESION IMG BX SPEC STEREO GUIDE 05/01/2023 [facility]   BREAST BIOPSY Left 05/08/2023   MM LT BREAST BX W LOC DEV EA AD LESION IMG BX SPEC STEREO GUIDE 05/08/2023 [facility]   BREAST BIOPSY Left 05/08/2023   MM LT BREAST BX W LOC DEV 1ST LESION IMAGE BX SPEC STEREO GUIDE 05/08/2023 [facility]   TUBAL LIGATION  1994    Current Medications: Current Meds  Medication Sig   amLODipine (NORVASC) 5 MG tablet TAKE 1 TABLET(5 MG) BY MOUTH DAILY   metFORMIN (GLUCOPHAGE) 500 MG tablet Take 500 mg by mouth 2 (two) times daily.   Current Facility-Administered Medications for the 05/23/23 encounter (Office Visit) with Aysia Lowder, Rachelle Hora, MD  Medication   0.9 %  sodium chloride infusion     Allergies:   Patient has no known allergies.   Social History   Socioeconomic History   Marital status: Significant Other  Spouse name: Not on file   Number of children: 2   Years of education: Not on file   Highest education level: Not on file  Occupational History    Employer: [employer]   Tobacco Use   Smoking status: Never   Smokeless tobacco: Never  Substance and Sexual Activity   Alcohol use: Yes    Alcohol/week: 10.0 standard drinks of alcohol    Types: 10 Standard drinks or equivalent per week    Comment: 2-3 glass wine/day,    Drug use: No   Sexual activity: Yes    Birth control/protection: Post-menopausal, Surgical    Comment: BTL  Other Topics Concern   Not on file   Social History Narrative   REGULAR EXERCISE:  3 X WEEKLY   CAFFEINE USE:  NO   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   Divorced   2 children (age 16 and 63)            Social Drivers of Corporate investment banker Strain: Not on file  Food Insecurity: No Food Insecurity (05/07/2023)   Hunger Vital Sign    Worried About Running Out of Food in the Last Year: Never true    Ran Out of Food in the Last Year: Never true  Transportation Needs: No Transportation Needs (05/07/2023)   PRAPARE - Administrator, Civil Service (Medical): No    Lack of Transportation (Non-Medical): No  Physical Activity: Not on file  Stress: Not on file  Social Connections: Unknown (07/10/2021)   Received from [facility]   Social Network    Social Network: Not on file     Family History: The patient's family history includes Diabetes in her sister; Heart attack in her father; Hyperlipidemia in her sister; Hypertension in her brother and mother; Lung cancer in her brother and maternal uncle; Urolithiasis in her brother. There is no history of Cancer, Kidney disease, Stroke, Colon cancer, Colon polyps, Esophageal cancer, Stomach cancer, or Rectal cancer.  ROS:   Please see the history of present illness.     All other systems reviewed and are negative.  EKGs/Labs/Other Studies Reviewed:    The following studies were reviewed today:  EKG Interpretation Date/Time:  Friday May 23 2023 09:49:41 EDT Ventricular Rate:  54 PR Interval:  148 QRS Duration:  130 QT Interval:  446 QTC Calculation: 422 R Axis:   -20  Text Interpretation: Sinus bradycardia Left bundle branch block When compared with ECG of 20-May-2023 09:44, No significant change was found Confirmed by Tashiana Lamarca [phone]) on 05/23/2023 1:08:00 PM      Previous ECG from 2018 was normal, sinus rhythm with narrow QRS  EKG Interpretation Date/Time:  Friday May 23 2023 09:49:41 EDT Ventricular  Rate:  54 PR Interval:  148 QRS Duration:  130 QT Interval:  446 QTC Calculation: 422 R Axis:   -20  Text Interpretation: Sinus bradycardia Left bundle branch block When compared with ECG of 20-May-2023 09:44, No significant change was found Confirmed by Xavior Niazi ([id number]) on 05/23/2023 1:08:00 PM    Recent Labs: 05/07/2023: ALT 12; BUN 20; Creatinine 1.02; Hemoglobin 10.9; Platelet Count 327; Potassium 4.2; Sodium 139  Recent Lipid Panel    Component Value Date/Time   CHOL 186 08/29/2016 1508   TRIG 140.0 08/29/2016 1508   HDL 65.10 08/29/2016 1508   CHOLHDL 3 08/29/2016 1508   VLDL 28.0 08/29/2016 1508   LDLCALC 92 08/29/2016 1508  Risk Assessment/Calculations:                Physical Exam:    VS:  BP 133/77   Pulse (!) 54   Ht 5\' 5"  (1.651 m)   Wt 166 lb 12.8 oz (75.7 kg)   LMP 05/17/2017   SpO2 99%   BMI 27.76 kg/m     Wt Readings from Last 3 Encounters:  05/23/23 166 lb 12.8 oz (75.7 kg)  05/07/23 164 lb 14.4 oz (74.8 kg)  07/09/17 180 lb (81.6 kg)     GEN:  Well nourished, well developed in no acute distress HEENT: Normal NECK: No JVD; No carotid bruits LYMPHATICS: No lymphadenopathy CARDIAC: RRR, normal S1, paradoxically split S2, no murmurs, rubs, gallops RESPIRATORY:  Clear to auscultation without rales, wheezing or rhonchi  ABDOMEN: Soft, non-tender, non-distended MUSCULOSKELETAL:  No edema; No deformity  SKIN: Warm and dry NEUROLOGIC:  Alert and oriented x 3 PSYCHIATRIC:  Normal affect   ASSESSMENT:    1. Left bundle branch block   2. Mild HTN   3. Preoperative cardiovascular examination    PLAN:    In order of problems listed above:  LBBB: New from 2018 but asymptomatic.  Her single episode of syncope sounds distinctly like a vasovagal event.  Scheduled for echocardiogram, but suspect that the LBBB will be an independent abnormality, without underlying structural heart disease.  If there is evidence of decreased LV function/left  ventricular dilation or excessive hypertrophy, may need additional testing before breast surgery.  If there are no structural abnormalities (which is most likely), we will simply monitor over the years, understanding that there is a chance that it could eventually progress to high-grade AV block and she might eventually need a pacemaker. HTN: Well-controlled. Preop CV evaluation: Less there are unexpected findings on her echocardiogram, she is at low risk for major cardiovascular complications with breast surgery.           Medication Adjustments/Labs and Tests Ordered: Current medicines are reviewed at length with the patient today.  Concerns regarding medicines are outlined above.  Orders Placed This Encounter  Procedures   EKG 12-Lead   ECHOCARDIOGRAM COMPLETE   No orders of the defined types were placed in this encounter.   Patient Instructions  Medication Instructions:  No changes *If you need a refill on your cardiac medications before your next appointment, please call your pharmacy*  Testing/Procedures: Your physician has requested that you have an echocardiogram. Echocardiography is a painless test that uses sound waves to create images of your heart. It provides your doctor with information about the size and shape of your heart and how well your heart's chambers and valves are working. This procedure takes approximately one hour. There are no restrictions for this procedure. Please do NOT wear cologne, perfume, aftershave, or lotions (deodorant is allowed). Please arrive 15 minutes prior to your appointment time.  Please note: We ask at that you not bring children with you during ultrasound (echo/ vascular) testing. Due to room size and safety concerns, children are not allowed in the ultrasound rooms during exams. Our front office staff cannot provide observation of children in our lobby area while testing is being conducted. An adult accompanying a patient to their  appointment will only be allowed in the ultrasound room at the discretion of the ultrasound technician under special circumstances. We apologize for any inconvenience.   Follow-Up: At [facility], you and your health needs are our priority.  As part of  our continuing mission to provide you with exceptional heart care, our providers are all part of one team.  This team includes your primary Cardiologist (physician) and Advanced Practice Providers or APPs (Physician Assistants and Nurse Practitioners) who all work together to provide you with the care you need, when you need it.  Your next appointment:   1 year(s)  Provider:   Thurmon Fair, MD     We recommend signing up for the patient portal called "MyChart".  Sign up information is provided on this After Visit Summary.  MyChart is used to connect with patients for Virtual Visits (Telemedicine).  Patients are able to view lab/test results, encounter notes, upcoming appointments, etc.  Non-urgent messages can be sent to your provider as well.   To learn more about what you can do with MyChart, go to [email or web address].         1st Floor: - Lobby - Registration  - Pharmacy  - Lab - Cafe  2nd Floor: - PV Lab - Diagnostic Testing (echo, CT, nuclear med)  3rd Floor: - Vacant  4th Floor: - TCTS (cardiothoracic surgery) - AFib Clinic - Structural Heart Clinic - Vascular Surgery  - Vascular Ultrasound  5th Floor: - HeartCare Cardiology (general and EP) - Clinical Pharmacy for coumadin, hypertension, lipid, weight-loss medications, and med management appointments    Valet parking services will be available as well.      Signed, Thurmon Fair, MD  05/23/2023 1:16 PM     [facility]

## 2023-05-23 NOTE — Telephone Encounter (Signed)
 I spoke to [name] [name] to review results of genetic testing. she had genetic testing with [facility] BRCAPlus and CancerNext-Expanded +RNAinsight panels. 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 We discussed that we typically do not use VUS to make management decisions or identify at risk family members. However, we did discuss that with the BRIP1,  c.3196delT ([id number]*56) variant there are other labs that still classify this variant as likely pathogenic, meaning they believe it does impact gene function and may increase a woman's risk to develop ovarian cancer. [facility] previously called this variant likely pathogenic, but recently downgraded it to be a variant of unknown significance due to internal data. Per [facility]end[facility] Col[facility]:   We have reached out to other labs to get more clarity on their current classifications for this variant and will follow up with [name] [name] to discuss any additional information obtained.   Based on currently available data provided by [name] [name], we do not know why she has breast cancer or why there is cancer in the family. It could be due to a different gene that we are not testing, or maybe our current technology may not be able to pick something up.  It will be important for her to keep in contact with genetics to keep up with whether additional testing may be needed. We will contact her if new information is learned about the VUS.   Please see counseling note for further detail on this result.

## 2023-05-26 ENCOUNTER — Encounter (HOSPITAL_BASED_OUTPATIENT_CLINIC_OR_DEPARTMENT_OTHER): Admission: RE | Payer: Self-pay | Source: Home / Self Care

## 2023-05-26 ENCOUNTER — Ambulatory Visit (HOSPITAL_BASED_OUTPATIENT_CLINIC_OR_DEPARTMENT_OTHER): Admission: RE | Admit: 2023-05-26 | Payer: Self-pay | Source: Home / Self Care | Admitting: General Surgery

## 2023-05-26 ENCOUNTER — Encounter

## 2023-05-26 ENCOUNTER — Encounter: Payer: Self-pay | Admitting: Cardiovascular Disease

## 2023-05-26 ENCOUNTER — Ambulatory Visit: Attending: Cardiovascular Disease

## 2023-05-26 DIAGNOSIS — I10 Essential (primary) hypertension: Secondary | ICD-10-CM

## 2023-05-26 DIAGNOSIS — I447 Left bundle-branch block, unspecified: Secondary | ICD-10-CM

## 2023-05-26 DIAGNOSIS — Z01810 Encounter for preprocedural cardiovascular examination: Secondary | ICD-10-CM

## 2023-05-26 HISTORY — DX: Type 2 diabetes mellitus without complications: E11.9

## 2023-05-26 LAB — ECHOCARDIOGRAM COMPLETE
MV M vel: 5.23 m/s
MV Peak grad: 109.4 mmHg
Radius: 0.8 cm
S' Lateral: 3.4 cm

## 2023-05-26 SURGERY — BREAST LUMPECTOMY WITH RADIOACTIVE SEED LOCALIZATION
Anesthesia: General | Laterality: Right

## 2023-05-27 ENCOUNTER — Encounter: Payer: Self-pay | Admitting: *Deleted

## 2023-05-28 ENCOUNTER — Other Ambulatory Visit: Payer: Self-pay | Admitting: Emergency Medicine

## 2023-05-28 DIAGNOSIS — I340 Nonrheumatic mitral (valve) insufficiency: Secondary | ICD-10-CM

## 2023-05-28 NOTE — Progress Notes (Signed)
 Thurmon Fair, MD 05/26/2023  4:59 PM EDT     Normal heart pumping strength. There is a moderate leak in the mitral valve, that we should check again with an echo next year, maybe eventually a TEE, but nothing that should delay her surgery with Dr. Carolynne Edouard.    Seen by patient  Echo ordered for 1 year from now

## 2023-05-30 ENCOUNTER — Other Ambulatory Visit: Payer: Self-pay

## 2023-05-30 ENCOUNTER — Encounter (HOSPITAL_BASED_OUTPATIENT_CLINIC_OR_DEPARTMENT_OTHER): Payer: Self-pay | Admitting: General Surgery

## 2023-05-30 ENCOUNTER — Encounter: Payer: Self-pay | Admitting: General Practice

## 2023-05-30 NOTE — Progress Notes (Signed)
 [facility] Spiritual Care Note  Left second [facility] follow-up voicemail, encouraging return call.   [address] [place], [place], [facility] Pager [phone] Voicemail [phone]

## 2023-06-05 ENCOUNTER — Encounter: Payer: Self-pay | Admitting: *Deleted

## 2023-06-05 ENCOUNTER — Telehealth: Payer: Self-pay | Admitting: Radiation Oncology

## 2023-06-05 ENCOUNTER — Ambulatory Visit
Admission: RE | Admit: 2023-06-05 | Discharge: 2023-06-05 | Disposition: A | Discharge status: Home / Self Care | Source: Ambulatory Visit | Attending: General Surgery | Admitting: General Surgery

## 2023-06-05 DIAGNOSIS — D0511 Intraductal carcinoma in situ of right breast: Secondary | ICD-10-CM

## 2023-06-05 HISTORY — PX: BREAST BIOPSY: SHX20

## 2023-06-05 NOTE — Telephone Encounter (Signed)
 3/28 Patient's surgery was cancel due to need clearance from cards. Waiting on confirmation of new surgery date, before schedule.

## 2023-06-06 ENCOUNTER — Encounter (HOSPITAL_BASED_OUTPATIENT_CLINIC_OR_DEPARTMENT_OTHER)
Admission: RE | Disposition: A | Discharge status: Home / Self Care | Payer: Self-pay | Source: Home / Self Care | Attending: General Surgery

## 2023-06-06 ENCOUNTER — Telehealth: Payer: Self-pay | Admitting: Radiation Oncology

## 2023-06-06 ENCOUNTER — Ambulatory Visit (HOSPITAL_BASED_OUTPATIENT_CLINIC_OR_DEPARTMENT_OTHER): Admitting: Anesthesiology

## 2023-06-06 ENCOUNTER — Other Ambulatory Visit: Payer: Self-pay

## 2023-06-06 ENCOUNTER — Encounter (HOSPITAL_BASED_OUTPATIENT_CLINIC_OR_DEPARTMENT_OTHER): Payer: Self-pay | Admitting: General Surgery

## 2023-06-06 ENCOUNTER — Ambulatory Visit (HOSPITAL_BASED_OUTPATIENT_CLINIC_OR_DEPARTMENT_OTHER)
Admission: RE | Admit: 2023-06-06 | Discharge: 2023-06-06 | Disposition: A | Discharge status: Home / Self Care | Attending: General Surgery | Admitting: General Surgery

## 2023-06-06 ENCOUNTER — Ambulatory Visit
Admission: RE | Admit: 2023-06-06 | Discharge: 2023-06-06 | Disposition: A | Discharge status: Home / Self Care | Source: Ambulatory Visit | Attending: General Surgery | Admitting: General Surgery

## 2023-06-06 DIAGNOSIS — Z1722 Progesterone receptor negative status: Secondary | ICD-10-CM | POA: Diagnosis not present

## 2023-06-06 DIAGNOSIS — Z7984 Long term (current) use of oral hypoglycemic drugs: Secondary | ICD-10-CM | POA: Diagnosis not present

## 2023-06-06 DIAGNOSIS — I10 Essential (primary) hypertension: Secondary | ICD-10-CM | POA: Insufficient documentation

## 2023-06-06 DIAGNOSIS — E119 Type 2 diabetes mellitus without complications: Secondary | ICD-10-CM | POA: Insufficient documentation

## 2023-06-06 DIAGNOSIS — N641 Fat necrosis of breast: Secondary | ICD-10-CM | POA: Diagnosis not present

## 2023-06-06 DIAGNOSIS — D0511 Intraductal carcinoma in situ of right breast: Secondary | ICD-10-CM | POA: Diagnosis present

## 2023-06-06 DIAGNOSIS — Z171 Estrogen receptor negative status [ER-]: Secondary | ICD-10-CM | POA: Insufficient documentation

## 2023-06-06 HISTORY — DX: Left bundle-branch block, unspecified: I44.7

## 2023-06-06 HISTORY — PX: BREAST LUMPECTOMY WITH RADIOACTIVE SEED LOCALIZATION: SHX6424

## 2023-06-06 SURGERY — BREAST LUMPECTOMY WITH RADIOACTIVE SEED LOCALIZATION
Anesthesia: General | Site: Breast | Laterality: Right

## 2023-06-06 MED ORDER — GLYCOPYRROLATE PF 0.2 MG/ML IJ SOSY
PREFILLED_SYRINGE | INTRAMUSCULAR | Status: DC | PRN
  Administered 2023-06-06: .2 mg via INTRAVENOUS

## 2023-06-06 MED ORDER — GLYCOPYRROLATE PF 0.2 MG/ML IJ SOSY
PREFILLED_SYRINGE | INTRAMUSCULAR | Status: AC
  Filled 2023-06-06: qty 1

## 2023-06-06 MED ORDER — OXYCODONE HCL 5 MG PO TABS
5.0000 mg | ORAL_TABLET | Freq: Four times a day (QID) | ORAL | 0 refills | Status: AC | PRN
Start: 2023-06-06 — End: ?

## 2023-06-06 MED ORDER — SCOPOLAMINE 1 MG/3DAYS TD PT72
1.0000 | MEDICATED_PATCH | TRANSDERMAL | Status: DC
  Administered 2023-06-06: 1.5 mg via TRANSDERMAL

## 2023-06-06 MED ORDER — CEFAZOLIN SODIUM-DEXTROSE 2-4 GM/100ML-% IV SOLN
2.0000 g | INTRAVENOUS | Status: AC
Start: 2023-06-06 — End: 2023-06-06
  Administered 2023-06-06: 2 g via INTRAVENOUS

## 2023-06-06 MED ORDER — BUPIVACAINE-EPINEPHRINE (PF) 0.25% -1:200000 IJ SOLN
INTRAMUSCULAR | Status: DC | PRN
  Administered 2023-06-06: 20 mL

## 2023-06-06 MED ORDER — DEXAMETHASONE SODIUM PHOSPHATE 10 MG/ML IJ SOLN
INTRAMUSCULAR | Status: AC
  Filled 2023-06-06: qty 1

## 2023-06-06 MED ORDER — LIDOCAINE 2% (20 MG/ML) 5 ML SYRINGE
INTRAMUSCULAR | Status: AC
  Filled 2023-06-06: qty 5

## 2023-06-06 MED ORDER — PHENYLEPHRINE 80 MCG/ML (10ML) SYRINGE FOR IV PUSH (FOR BLOOD PRESSURE SUPPORT)
PREFILLED_SYRINGE | INTRAVENOUS | Status: DC | PRN
  Administered 2023-06-06: 160 ug via INTRAVENOUS
  Administered 2023-06-06: 80 ug via INTRAVENOUS

## 2023-06-06 MED ORDER — GABAPENTIN 100 MG PO CAPS
ORAL_CAPSULE | ORAL | Status: AC
  Filled 2023-06-06: qty 1

## 2023-06-06 MED ORDER — DROPERIDOL 2.5 MG/ML IJ SOLN: 0.6250 mg | Freq: Once | INTRAMUSCULAR | Status: DC | PRN

## 2023-06-06 MED ORDER — KETOROLAC TROMETHAMINE 30 MG/ML IJ SOLN
INTRAMUSCULAR | Status: DC | PRN
  Administered 2023-06-06: 30 mg via INTRAVENOUS

## 2023-06-06 MED ORDER — KETOROLAC TROMETHAMINE 30 MG/ML IJ SOLN
INTRAMUSCULAR | Status: AC
  Filled 2023-06-06: qty 1

## 2023-06-06 MED ORDER — 0.9 % SODIUM CHLORIDE (POUR BTL) OPTIME
TOPICAL | Status: DC | PRN
  Administered 2023-06-06: 500 mL

## 2023-06-06 MED ORDER — ACETAMINOPHEN 500 MG PO TABS
ORAL_TABLET | ORAL | Status: AC
  Filled 2023-06-06: qty 2

## 2023-06-06 MED ORDER — CHLORHEXIDINE GLUCONATE CLOTH 2 % EX PADS: 6.0000 | MEDICATED_PAD | Freq: Once | CUTANEOUS | Status: DC

## 2023-06-06 MED ORDER — PROPOFOL 10 MG/ML IV BOLUS
INTRAVENOUS | Status: DC | PRN
  Administered 2023-06-06: 160 mg via INTRAVENOUS

## 2023-06-06 MED ORDER — ACETAMINOPHEN 500 MG PO TABS
1000.0000 mg | ORAL_TABLET | ORAL | Status: AC
  Administered 2023-06-06: 1000 mg via ORAL

## 2023-06-06 MED ORDER — FENTANYL CITRATE (PF) 100 MCG/2ML IJ SOLN
INTRAMUSCULAR | Status: DC | PRN
  Administered 2023-06-06 (×2): 50 ug via INTRAVENOUS

## 2023-06-06 MED ORDER — OXYCODONE HCL 5 MG PO TABS: 5.0000 mg | ORAL_TABLET | Freq: Once | ORAL | Status: DC | PRN

## 2023-06-06 MED ORDER — FENTANYL CITRATE (PF) 100 MCG/2ML IJ SOLN
25.0000 ug | INTRAMUSCULAR | Status: DC | PRN
  Administered 2023-06-06 (×2): 50 ug via INTRAVENOUS

## 2023-06-06 MED ORDER — LACTATED RINGERS IV SOLN
INTRAVENOUS | Status: DC | PRN
Start: 2023-06-06 — End: 2023-06-06

## 2023-06-06 MED ORDER — SCOPOLAMINE 1 MG/3DAYS TD PT72
MEDICATED_PATCH | TRANSDERMAL | Status: AC
  Filled 2023-06-06: qty 1

## 2023-06-06 MED ORDER — MIDAZOLAM HCL 5 MG/5ML IJ SOLN
INTRAMUSCULAR | Status: DC | PRN
  Administered 2023-06-06: 2 mg via INTRAVENOUS

## 2023-06-06 MED ORDER — LIDOCAINE 2% (20 MG/ML) 5 ML SYRINGE
INTRAMUSCULAR | Status: DC | PRN
  Administered 2023-06-06: 50 mg via INTRAVENOUS

## 2023-06-06 MED ORDER — ONDANSETRON HCL 4 MG/2ML IJ SOLN
INTRAMUSCULAR | Status: AC
  Filled 2023-06-06: qty 2

## 2023-06-06 MED ORDER — DEXMEDETOMIDINE HCL IN NACL 80 MCG/20ML IV SOLN
INTRAVENOUS | Status: DC | PRN
Start: 2023-06-06 — End: 2023-06-06
  Administered 2023-06-06: 8 ug via INTRAVENOUS

## 2023-06-06 MED ORDER — OXYCODONE HCL 5 MG/5ML PO SOLN: 5.0000 mg | Freq: Once | ORAL | Status: DC | PRN

## 2023-06-06 MED ORDER — FENTANYL CITRATE (PF) 100 MCG/2ML IJ SOLN
INTRAMUSCULAR | Status: AC
  Filled 2023-06-06: qty 2

## 2023-06-06 MED ORDER — CEFAZOLIN SODIUM-DEXTROSE 2-4 GM/100ML-% IV SOLN
INTRAVENOUS | Status: AC
  Filled 2023-06-06: qty 100

## 2023-06-06 MED ORDER — MIDAZOLAM HCL 2 MG/2ML IJ SOLN
INTRAMUSCULAR | Status: AC
  Filled 2023-06-06: qty 2

## 2023-06-06 MED ORDER — PHENYLEPHRINE 80 MCG/ML (10ML) SYRINGE FOR IV PUSH (FOR BLOOD PRESSURE SUPPORT)
PREFILLED_SYRINGE | INTRAVENOUS | Status: AC
  Filled 2023-06-06: qty 10

## 2023-06-06 MED ORDER — GABAPENTIN 100 MG PO CAPS
100.0000 mg | ORAL_CAPSULE | ORAL | Status: AC
  Administered 2023-06-06: 100 mg via ORAL

## 2023-06-06 MED ORDER — ACETAMINOPHEN 10 MG/ML IV SOLN: 1000.0000 mg | Freq: Once | INTRAVENOUS | Status: DC | PRN

## 2023-06-06 MED ORDER — ONDANSETRON HCL 4 MG/2ML IJ SOLN
INTRAMUSCULAR | Status: DC | PRN
  Administered 2023-06-06: 4 mg via INTRAVENOUS

## 2023-06-06 MED ORDER — DEXAMETHASONE SODIUM PHOSPHATE 10 MG/ML IJ SOLN
INTRAMUSCULAR | Status: DC | PRN
  Administered 2023-06-06: 8 mg via INTRAVENOUS

## 2023-06-06 SURGICAL SUPPLY — 34 items
APPLIER CLIP 9.375 MED OPEN (MISCELLANEOUS) ×1 IMPLANT
BLADE SURG 15 STRL LF DISP TIS (BLADE) ×2 IMPLANT
CANISTER SUC SOCK COL 7IN (MISCELLANEOUS) ×2 IMPLANT
CANISTER SUCT 1200ML W/VALVE (MISCELLANEOUS) ×2 IMPLANT
CHLORAPREP W/TINT 26 (MISCELLANEOUS) ×2 IMPLANT
CLIP APPLIE 9.375 MED OPEN (MISCELLANEOUS) IMPLANT
COVER BACK TABLE 60X90IN (DRAPES) ×2 IMPLANT
COVER MAYO STAND STRL (DRAPES) ×2 IMPLANT
COVER PROBE CYLINDRICAL 5X96 (MISCELLANEOUS) ×2 IMPLANT
DERMABOND ADVANCED .7 DNX12 (GAUZE/BANDAGES/DRESSINGS) ×2 IMPLANT
DRAPE LAPAROSCOPIC ABDOMINAL (DRAPES) ×2 IMPLANT
DRAPE UTILITY XL STRL (DRAPES) ×2 IMPLANT
ELECT COATED BLADE 2.86 ST (ELECTRODE) ×2 IMPLANT
ELECT REM PT RETURN 9FT ADLT (ELECTROSURGICAL) ×1 IMPLANT
ELECTRODE REM PT RTRN 9FT ADLT (ELECTROSURGICAL) ×2 IMPLANT
GLOVE BIO SURGEON STRL SZ7.5 (GLOVE) ×4 IMPLANT
GOWN STRL REUS W/ TWL LRG LVL3 (GOWN DISPOSABLE) ×4 IMPLANT
KIT MARKER MARGIN INK (KITS) ×2 IMPLANT
NDL HYPO 25X1 1.5 SAFETY (NEEDLE) IMPLANT
NEEDLE HYPO 25X1 1.5 SAFETY (NEEDLE) ×1 IMPLANT
NS IRRIG 1000ML POUR BTL (IV SOLUTION) IMPLANT
PACK BASIN DAY SURGERY FS (CUSTOM PROCEDURE TRAY) ×2 IMPLANT
PENCIL SMOKE EVACUATOR (MISCELLANEOUS) ×2 IMPLANT
SLEEVE SCD COMPRESS KNEE MED (STOCKING) ×2 IMPLANT
SPIKE FLUID TRANSFER (MISCELLANEOUS) IMPLANT
SPONGE T-LAP 18X18 ~~LOC~~+RFID (SPONGE) ×2 IMPLANT
SUT MON AB 4-0 PC3 18 (SUTURE) ×2 IMPLANT
SUT SILK 2 0 SH (SUTURE) IMPLANT
SUT VICRYL 3-0 CR8 SH (SUTURE) ×2 IMPLANT
SYR CONTROL 10ML LL (SYRINGE) IMPLANT
TOWEL GREEN STERILE FF (TOWEL DISPOSABLE) ×2 IMPLANT
TRAY FAXITRON CT DISP (TRAY / TRAY PROCEDURE) ×2 IMPLANT
TUBE CONNECTING 20X1/4 (TUBING) ×2 IMPLANT
YANKAUER SUCT BULB TIP NO VENT (SUCTIONS) IMPLANT

## 2023-06-06 NOTE — Anesthesia Preprocedure Evaluation (Addendum)
 Anesthesia Evaluation  Patient identified by MRN, date of birth, ID band Patient awake    Reviewed: Allergy & Precautions, NPO status , Patient's Chart, lab work & pertinent test results  Airway Mallampati: I  TM Distance: >3 FB Neck ROM: Full    Dental  (+) Teeth Intact, Dental Advisory Given   Pulmonary neg pulmonary ROS   breath sounds clear to auscultation       Cardiovascular hypertension, Pt. on medications + dysrhythmias  Rhythm:Regular Rate:Normal     Neuro/Psych negative neurological ROS  negative psych ROS   GI/Hepatic negative GI ROS, Neg liver ROS,,,  Endo/Other  diabetes, Type 2, Oral Hypoglycemic Agents    Renal/GU negative Renal ROS     Musculoskeletal  (+) Arthritis ,    Abdominal   Peds  Hematology  (+) Blood dyscrasia, anemia   Anesthesia Other Findings   Reproductive/Obstetrics                             Anesthesia Physical Anesthesia Plan  ASA: 2  Anesthesia Plan: General   Post-op Pain Management: Tylenol PO (pre-op)* and Toradol IV (intra-op)*   Induction: Intravenous  PONV Risk Score and Plan: 4 or greater and Ondansetron, Dexamethasone, Midazolam and Scopolamine patch - Pre-op  Airway Management Planned: LMA  Additional Equipment: None  Intra-op Plan:   Post-operative Plan: Extubation in OR  Informed Consent: I have reviewed the patients History and Physical, chart, labs and discussed the procedure including the risks, benefits and alternatives for the proposed anesthesia with the patient or authorized representative who has indicated his/her understanding and acceptance.     Dental advisory given  Plan Discussed with: CRNA  Anesthesia Plan Comments:        Anesthesia Quick Evaluation

## 2023-06-06 NOTE — Op Note (Signed)
 06/06/2023  10:59 AM  PATIENT:  [name] [name]  58 y.o. female  PRE-OPERATIVE DIAGNOSIS:  RIGHT BREAST DCIS  POST-OPERATIVE DIAGNOSIS:  RIGHT BREAST DCIS  PROCEDURE:  Procedure(s): BREAST LUMPECTOMY WITH RADIOACTIVE SEED LOCALIZATION (Right)  SURGEON:  Surgeons and Role:    Griselda Miner, MD - Primary  PHYSICIAN ASSISTANT:   ASSISTANTS: none   ANESTHESIA:   local and general  EBL:  minimal   BLOOD ADMINISTERED:none  DRAINS: none   LOCAL MEDICATIONS USED:  MARCAINE     SPECIMEN:  Source of Specimen:  right breast tissue with additional superior margin  DISPOSITION OF SPECIMEN:  PATHOLOGY  COUNTS:  YES  TOURNIQUET:  * No tourniquets in log *  DICTATION: .Dragon Dictation  After informed consent was obtained the patient was brought to the operating room and placed in the supine position on the operating room table.  After adequate induction of general anesthesia the patient's right breast was prepped with ChloraPrep, allowed to dry, and draped in usual sterile manner.  An appropriate timeout was performed.  Previously an I-125 seed was placed in the lower outer quadrant of the right breast to mark an area of ductal carcinoma in situ.  The neoprobe was set to I-125 in the area of radioactivity was readily identified in the lower outer quadrant.  Because of the superficial position of the signal I elected to make an elliptical incision in the skin overlying the area of radioactivity with a 15 blade knife.  The incision was carried through the skin and subcutaneous tissue sharply with the electrocautery.  Dissection was then carried widely around the radioactive seed while checking the area of radioactivity frequently.  Once the tissue was removed it was oriented with the appropriate paint colors.  A specimen radiograph was obtained that showed the seed and 2 clips to be near the center of the specimen.  I did elect to take an additional superior margin and this was marked  appropriately.  All of the tissue was then sent to pathology for further evaluation.  Hemostasis was achieved using the Bovie electrocautery.  The wound was irrigated with saline and infiltrated with more quarter percent Marcaine.  The cavity was marked with clips.  The deep layer of the incision was then closed with layers of interrupted 3-0 Vicryl stitches.  The skin was closed with interrupted 4-0 Monocryl subcuticular stitches.  Dermabond dressings were applied.  The patient tolerated the procedure well.  At the end of the case all needle sponge and instrument counts were correct.  The patient was then awakened and taken to recovery in stable condition.  PLAN OF CARE: Discharge to home after PACU  PATIENT DISPOSITION:  PACU - hemodynamically stable.   Delay start of Pharmacological VTE agent (>24hrs) due to surgical blood loss or risk of bleeding: not applicable

## 2023-06-06 NOTE — Transfer of Care (Signed)
 Immediate Anesthesia Transfer of Care Note  Patient: [name] [name]  Procedure(s) Performed: BREAST LUMPECTOMY WITH RADIOACTIVE SEED LOCALIZATION (Right: Breast)  Patient Location: PACU  Anesthesia Type:General  Level of Consciousness: drowsy  Airway & Oxygen Therapy: Patient Spontanous Breathing and Patient connected to face mask oxygen  Post-op Assessment: Report given to RN and Post -op Vital signs reviewed and stable  Post vital signs: Reviewed and stable  Last Vitals:  Vitals Value Taken Time  BP 121/57 06/06/23 1107  Temp    Pulse 69 06/06/23 1112  Resp 13 06/06/23 1112  SpO2 100 % 06/06/23 1112  Vitals shown include unfiled device data.  Last Pain:  Vitals:   06/06/23 0838  TempSrc: Temporal  PainSc: 0-No pain         Complications: No notable events documented.

## 2023-06-06 NOTE — Telephone Encounter (Signed)
 4/11 Received inbasket message from Angie Fava, patient new surgery date 4/11.  Patient currently in OR, follow up next week to schedule follow up consult.

## 2023-06-06 NOTE — H&P (Signed)
 REFERRING PHYSICIAN: Sabas Sous, MD PROVIDER: Lindell Noe, MD MRN: [id number] DOB: [birth year] Subjective   Chief Complaint: Breast Cancer  History of Present Illness: [name] [name] is a 58 y.o. female who is seen today as an office consultation for evaluation of Breast Cancer  We are asked to see the patient in consultation by Dr. Pamelia Hoit to evaluate her for a new right breast cancer. The patient is a 58 year old black female who recently went for a routine screening mammogram. At that time she was found to have a right breast asymmetry and calcifications on both sides. The asymmetry disappeared on follow-up. The calcifications in the right breast are in the upper outer quadrant and measured 2.3 cm. 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 There were 2 small clusters of calcification in the left breast that are being biopsied tomorrow. She does have hypertension and diabetes but does not smoke  Review of Systems: A complete review of systems was obtained from the patient. I have reviewed this information and discussed as appropriate with the patient. See HPI as well for other ROS.  ROS   Medical History: Past Medical History:  Diagnosis Date  Anemia  Arthritis  Diabetes mellitus without complication (CMS/HHS-HCC)  History of cancer  Hypertension   Patient Active Problem List  Diagnosis  Ductal carcinoma in situ (DCIS) of right breast   Past Surgical History:  Procedure Laterality Date  .Right Breast Biopsy Right 05/01/2023  LAPAROSCOPIC TUBAL LIGATION N/A  1994    No Known Allergies  Current Outpatient Medications on File Prior to Visit  Medication Sig Dispense Refill  amLODIPine (NORVASC) 5 MG tablet Take 5 mg by mouth once daily  baclofen (LIORESAL) 10 MG tablet Take 10 mg by mouth 3 (three) times daily  famotidine (PEPCID) 20 MG tablet Take 20 mg by mouth at bedtime  guaiFENesin 1,200 mg Ta12 Take  1,200 mg by mouth once daily  omeprazole (PRILOSEC) 20 MG DR capsule Take 20 mg by mouth once daily  metFORMIN (GLUCOPHAGE) 500 MG tablet Take 500 mg by mouth 2 (two) times daily  multivitamin tablet Take 1 tablet by mouth once daily   No current facility-administered medications on file prior to visit.   Family History  Problem Relation Age of Onset  High blood pressure (Hypertension) Mother  Coronary Artery Disease (Blocked arteries around heart) Father  Obesity Sister  High blood pressure (Hypertension) Sister  Hyperlipidemia (Elevated cholesterol) Sister  Diabetes Sister  High blood pressure (Hypertension) Brother  Hyperlipidemia (Elevated cholesterol) Brother    Social History   Tobacco Use  Smoking Status Never  Smokeless Tobacco Never    Social History   Socioeconomic History  Marital status: Single  Tobacco Use  Smoking status: Never  Smokeless tobacco: Never  Vaping Use  Vaping status: Never Used  Substance and Sexual Activity  Alcohol use: Yes  Alcohol/week: 7.0 standard drinks of alcohol  Types: 7 Glasses of wine per week  Drug use: Never   Social Drivers of Health   Received from [employer]  Social Network   Objective:  There were no vitals filed for this visit.  There is no height or weight on file to calculate BMI.  Physical Exam Vitals reviewed.  Constitutional:  General: She is not in acute distress. Appearance: Normal appearance.  HENT:  Head: Normocephalic and atraumatic.  Right Ear: External ear normal.  Left Ear: External ear normal.  Nose: Nose normal.  Mouth/Throat:  Mouth: Mucous membranes are moist.  Pharynx: Oropharynx is clear.  Eyes:  General: No scleral icterus. Extraocular Movements: Extraocular movements intact.  Conjunctiva/sclera: Conjunctivae normal.  Pupils: Pupils are equal, round, and reactive to light.  Cardiovascular:  Rate and Rhythm: Normal rate and regular rhythm.  Pulses: Normal pulses.  Heart sounds:  Normal heart sounds.  Pulmonary:  Effort: Pulmonary effort is normal. No respiratory distress.  Breath sounds: Normal breath sounds.  Abdominal:  General: Bowel sounds are normal.  Palpations: Abdomen is soft.  Tenderness: There is no abdominal tenderness.  Musculoskeletal:  General: No swelling, tenderness or deformity. Normal range of motion.  Cervical back: Normal range of motion and neck supple.  Skin: General: Skin is warm and dry.  Coloration: Skin is not jaundiced.  Neurological:  General: No focal deficit present.  Mental Status: She is alert and oriented to person, place, and time.  Psychiatric:  Mood and Affect: Mood normal.  Behavior: Behavior normal.     Breast: There is no palpable mass in either breast. There is no palpable axillary, supraclavicular, or cervical lymphadenopathy  Labs, Imaging and Diagnostic Testing:  Assessment and Plan:   Diagnoses and all orders for this visit:  Ductal carcinoma in situ (DCIS) of right breast   The patient appears to have a 2.3 cm area of ductal carcinoma in situ in the upper outer quadrant of the right breast. I have discussed with her in detail the different options for treatment and at this point she favors breast conservation which I feel is very reasonable. She would not need a node evaluation since it is pretty invasive. I have discussed with her in detail the risks and benefits of the operation as well as some of the technical aspects including use of a radioactive seed for localization and she understands and wishes to proceed. She is having her left breast biopsies tomorrow. We will call her with the results of this so that we can formulate an appropriate plan. She will also meet with medical and radiation oncology to discuss adjuvant therapy

## 2023-06-06 NOTE — Discharge Instructions (Addendum)
  Post Anesthesia Home Care Instructions  Next tylenol dose after 4pm. No Ibuprofen/NSAIDS before 6:30pm tonight. Activity: Get plenty of rest for the remainder of the day. A responsible individual must stay with you for 24 hours following the procedure.  For the next 24 hours, DO NOT: -Drive a car -Advertising copywriter -Drink alcoholic beverages -Take any medication unless instructed by your physician -Make any legal decisions or sign important papers.  Meals: Start with liquid foods such as gelatin or soup. Progress to regular foods as tolerated. Avoid greasy, spicy, heavy foods. If nausea and/or vomiting occur, drink only clear liquids until the nausea and/or vomiting subsides. Call your physician if vomiting continues.  Special Instructions/Symptoms: Your throat may feel dry or sore from the anesthesia or the breathing tube placed in your throat during surgery. If this causes discomfort, gargle with warm salt water. The discomfort should disappear within 24 hours.  If you had a scopolamine patch placed behind your ear for the management of post- operative nausea and/or vomiting:  1. The medication in the patch is effective for 72 hours, after which it should be removed.  Wrap patch in a tissue and discard in the trash. Wash hands thoroughly with soap and water. 2. You may remove the patch earlier than 72 hours if you experience unpleasant side effects which may include dry mouth, dizziness or visual disturbances. 3. Avoid touching the patch. Wash your hands with soap and water after contact with the patch.    Post Anesthesia Home Care Instructions  Activity: Get plenty of rest for the remainder of the day. A responsible individual must stay with you for 24 hours following the procedure.  For the next 24 hours, DO NOT: -Drive a car -Advertising copywriter -Drink alcoholic beverages -Take any medication unless instructed by your physician -Make any legal decisions or sign important  papers.  Meals: Start with liquid foods such as gelatin or soup. Progress to regular foods as tolerated. Avoid greasy, spicy, heavy foods. If nausea and/or vomiting occur, drink only clear liquids until the nausea and/or vomiting subsides. Call your physician if vomiting continues.  Special Instructions/Symptoms: Your throat may feel dry or sore from the anesthesia or the breathing tube placed in your throat during surgery. If this causes discomfort, gargle with warm salt water. The discomfort should disappear within 24 hours.  If you had a scopolamine patch placed behind your ear for the management of post- operative nausea and/or vomiting:  1. The medication in the patch is effective for 72 hours, after which it should be removed.  Wrap patch in a tissue and discard in the trash. Wash hands thoroughly with soap and water. 2. You may remove the patch earlier than 72 hours if you experience unpleasant side effects which may include dry mouth, dizziness or visual disturbances. 3. Avoid touching the patch. Wash your hands with soap and water after contact with the patch.

## 2023-06-06 NOTE — Anesthesia Procedure Notes (Signed)
 Procedure Name: LMA Insertion Date/Time: 06/06/2023 10:14 AM  Performed by: Yolanda Bonine, CRNAPre-anesthesia Checklist: Patient identified, Emergency Drugs available, Suction available, Patient being monitored and Timeout performed Patient Re-evaluated:Patient Re-evaluated prior to induction Oxygen Delivery Method: Circle system utilized Preoxygenation: Pre-oxygenation with 100% oxygen Induction Type: IV induction Ventilation: Mask ventilation without difficulty LMA: LMA inserted LMA Size: 4.0 Number of attempts: 1 Placement Confirmation: positive ETCO2 Dental Injury: Teeth and Oropharynx as per pre-operative assessment

## 2023-06-06 NOTE — Anesthesia Postprocedure Evaluation (Signed)
 Anesthesia Post Note  Patient: [name] [name]  Procedure(s) Performed: BREAST LUMPECTOMY WITH RADIOACTIVE SEED LOCALIZATION (Right: Breast)     Patient location during evaluation: PACU Anesthesia Type: General Level of consciousness: awake and alert Pain management: pain level controlled Vital Signs Assessment: post-procedure vital signs reviewed and stable Respiratory status: spontaneous breathing, nonlabored ventilation, respiratory function stable and patient connected to nasal cannula oxygen Cardiovascular status: blood pressure returned to baseline and stable Postop Assessment: no apparent nausea or vomiting Anesthetic complications: no  No notable events documented.  Last Vitals:  Vitals:   06/06/23 1200 06/06/23 1215  BP: 134/74 130/76  Pulse: (!) 55 (!) 50  Resp: 15 14  Temp:    SpO2: 98% 97%    Last Pain:  Vitals:   06/06/23 1200  TempSrc:   PainSc: 0-No pain                 Shelton Silvas

## 2023-06-06 NOTE — Interval H&P Note (Signed)
 History and Physical Interval Note:  06/06/2023 9:45 AM  [name] [name]  has presented today for surgery, with the diagnosis of RIGHT BREAST DCIS.  The various methods of treatment have been discussed with the patient and family. After consideration of risks, benefits and other options for treatment, the patient has consented to  Procedure(s): BREAST LUMPECTOMY WITH RADIOACTIVE SEED LOCALIZATION (Right) as a surgical intervention.  The patient's history has been reviewed, patient examined, no change in status, stable for surgery.  I have reviewed the patient's chart and labs.  Questions were answered to the patient's satisfaction.     Chevis Pretty III

## 2023-06-07 ENCOUNTER — Encounter (HOSPITAL_BASED_OUTPATIENT_CLINIC_OR_DEPARTMENT_OTHER): Payer: Self-pay | Admitting: General Surgery

## 2023-06-09 ENCOUNTER — Encounter: Payer: Self-pay | Admitting: General Surgery

## 2023-06-09 LAB — SURGICAL PATHOLOGY

## 2023-06-10 ENCOUNTER — Encounter: Payer: Self-pay | Admitting: *Deleted

## 2023-06-24 NOTE — Progress Notes (Signed)
 Radiation Oncology         [phone]) [phone] ________________________________  Name: [name] [name] MRN: [id number]  Date: 06/26/2023  DOB: 03/05/1965  Re-Evaluation Note  CC: [facility], [facility]  Cameron Cea, MD    ICD-10-CM   1. Ductal carcinoma in situ (DCIS) of right breast  D05.11        Diagnosis:  Stage 0 (cTis, N0, M0) Ductal Carcinoma in Situ of the right breast, ER- / PR-,Grade 3    Cancer Staging  Ductal carcinoma in situ (DCIS) of right breast Staging form: Breast, AJCC 8th Edition - Clinical: Stage 0 (cTis (DCIS), cN0, cM0, G3, ER-, PR-, HER2: Not Assessed) - Signed by Cameron Cea, MD on 05/07/2023   Narrative:  The patient returns today to discuss radiation treatment options. She was seen in the multidisciplinary breast clinic on 05/07/23.  Since consultation, she underwent genetic testing on 05/23/23. Results showed no pathogenetic mutation contributing to her recent diagnosis.  She opted to proceed with right breast lumpectomy with radioactive seed localization, w/o nodal biopsy on 06/06/23 under the care of Dr. Lillette Reid. Pathology from the procedure revealed: solid papillary and cribriform type, high grade DCIS with necrosis present; all margins negative for DCIS; closest margin 0.25 mm from deep margin and 3.5 mm from superior margin; Estrogen Receptor: Negative, Progesterone Receptor: Negative.     During her most resent follow up with Dr. Gudena, they opted to proceed with radiation therapy following her procedure as there's no role in antiestrogen therapy given her prognostic indicators.   On review of systems, the patient reports to be doing very well overall. She denies breast pain, swelling, issues with range of motion, and any other symptoms.    Allergies:  has no known allergies.  Meds: Current Outpatient Medications  Medication Sig Dispense Refill   amLODipine  (NORVASC ) 5 MG tablet TAKE 1 TABLET(5 MG) BY MOUTH DAILY 30 tablet 0   metFORMIN  (GLUCOPHAGE) 500 MG tablet Take 500 mg by mouth 2 (two) times daily.     Multiple Vitamin (MULTIVITAMIN) tablet Take 1 tablet by mouth daily.     oxyCODONE  (ROXICODONE ) 5 MG immediate release tablet Take 1 tablet (5 mg total) by mouth every 6 (six) hours as needed for severe pain (pain score 7-10). 10 tablet 0   VITAMIN D, ERGOCALCIFEROL, PO Take 25 mcg by mouth daily.     Current Facility-Administered Medications  Medication Dose Route Frequency Provider Last Rate Last Admin   0.9 %  sodium chloride  infusion  500 mL Intravenous Once Danis, Henry L III, MD        Physical Findings: The patient is in no acute distress. Patient is alert and oriented.  height is 5\' 5"  (1.651 m) and weight is 166 lb 6 oz (75.5 kg). Her temporal temperature is 97.7 F (36.5 C). Her blood pressure is 146/82 (abnormal) and her pulse is 75. Her respiration is 18 and oxygen saturation is 100%.  No significant changes. Lungs are clear to auscultation bilaterally. Heart has regular rate and rhythm. No palpable cervical, supraclavicular, or axillary adenopathy. Abdomen soft, non-tender, normal bowel sounds.  Left Breast: no palpable mass, nipple discharge or bleeding. Right Breast: lumpectomy incision in the lower portion of the breast with well-approximated skin edges and no signs of infection or delayed wound healing  Lab Findings: Lab Results  Component Value Date   WBC 7.9 05/07/2023   HGB 10.9 (L) 05/07/2023   HCT 34.5 (L) 05/07/2023   MCV 88.9 05/07/2023  PLT 327 05/07/2023    Radiographic Findings: MM Breast Surgical Specimen Result Date: 06/06/2023 CLINICAL DATA:  Specimen radiograph. EXAM: SPECIMEN RADIOGRAPH OF THE RIGHT BREAST COMPARISON:  Previous exam(s). FINDINGS: Status post excision of the right breast. The radioactive seed and biopsy marker clip are present, completely intact, and were marked for pathology. IMPRESSION: Specimen radiograph of the right breast. Electronically Signed   By: Delwin Files M.D.   On: 06/06/2023 10:45   MM RT RADIOACTIVE SEED LOC MAMMO GUIDE Result Date: 06/05/2023 CLINICAL DATA:  58 year old with biopsy-proven high-grade DCIS involving the LOWER OUTER QUADRANT of the RIGHT breast at middle to posterior depth (ribbon clip anterior, X clip posterior). The X clip migrated approximately 1.5 cm medial to the calcifications at the time of biopsy. The ribbon clip is appropriately positioned at the anterior extent of the calcifications. Radioactive seed localization is performed in anticipation of lumpectomy which is scheduled for tomorrow. EXAM: MAMMOGRAPHIC GUIDED RADIOACTIVE SEED LOCALIZATION OF THE RIGHT BREAST COMPARISON:  Previous exam(s). FINDINGS: Patient presents for radioactive seed localization prior to RIGHT breast lumpectomy. I met with the patient and we discussed the procedure of seed localization including benefits and alternatives. We discussed the high likelihood of a successful procedure. We discussed the risks of the procedure including infection, bleeding, tissue injury and further surgery. We discussed the low dose of radioactivity involved in the procedure. Informed, written consent was given. The usual time-out protocol was performed immediately prior to the procedure. Using mammographic guidance, sterile technique with chlorhexidine  as skin antisepsis, 1% lidocaine  as local anesthesia, an I-125 radioactive seed was used to localize the center of the group of calcifications using a lateral approach. The follow-up mammogram images confirm that the seed is appropriately positioned at the center of the group of calcifications. The ribbon clip at the anterior extent of calcifications is 1.1 cm anterior to the seed. The posterior extent of the calcifications is 1.0 cm posterior to the seed. The images are marked for Dr. Alethea Andes. Follow-up survey of the patient confirms the presence of the radioactive seed. Order number of I-125 seed: [id number] Total activity: 0.27 mCi  Reference Date: 04/17/2023 The patient tolerated the procedure well without apparent immediate complications. She was released from the [facility] with instructions regarding seed removal. IMPRESSION: Radioactive seed localization of biopsy-proven high-grade DCIS involving the LOWER OUTER QUADRANT of the RIGHT breast. Electronically Signed   By: Rinda Cheers M.D.   On: 06/05/2023 13:39    Impression: Stage 0 (cTis, N0, M0) Ductal Carcinoma in Situ of the right breast, ER- / PR-,Grade 3; s/p right lumpectomy  Patient is healing well from her lumpectomy. Dr. Eloise Hake recommends radiation to reduce the risk of locoregional disease recurrence. Today we discussed the natural course of early stage breast disease. We highlighted the role of radiotherapy in the management and focused on the details of logistics and delivery. We reviewed the anticipated acute and late sequelae associated with radiation in this setting. The patient was encouraged to ask questions that I answered to the best of my ability. Patient expressed readiness to proceed with treatment. A consent form was reviewed and signed today.    Plan:  Patient is scheduled for CT simulation later today. Will plan to begin radiation approximately one week after simulation. Plan to deliver 40.05 Gy in 15 fractions to the right breast followed by a boost of 12 Gy in 6 fractions to the lumpectomy cavity.  -----------------------------------   Julio Ohm, PA-C  This document serves  as a record of services personally performed by Julio Ohm, PA-C. It was created on his behalf by Lucky Sable, a trained medical scribe. The creation of this record is based on the scribe's personal observations and the provider's statements to them. This document has been checked and approved by the attending provider.

## 2023-06-24 NOTE — Progress Notes (Signed)
 Location of Breast Cancer:Right Side  Histology per Pathology Report:   Receptor Status: ER(negative), PR (negative)  Did patient present with symptoms (if so, please note symptoms) or was this found on screening mammography?:   05/01/2023   Screening mammogram     Past/Anticipated interventions by surgeon, if any: Right breast lumpectomy 06/06/2023 Past/Anticipated interventions by medical oncology, if any:  Recommendation: 1. Breast conserving surgery 2. Followed by adjuvant radiation therapy 3.  No role of antiestrogen therapy since the ER/PR are 0%.  (We will await the results of the left breast biopsy to determine adjuvant treatment plan for the left breast calcifications)  Dr. Cameron Cea MD 05/07/2023 Lymphedema issues, if any:  no   Skin issues: No  Pain issues, if any:  Reports tenderness to the touch.    SAFETY ISSUES: Prior radiation? no Pacemaker/ICD? no Possible current pregnancy?no Is the patient on methotrexate? no  Current Complaints / other details:  The patient is here today to discuss radiation treatment options.      BP (!) 146/82 (BP Location: Left Arm, Patient Position: Sitting)   Pulse 75   Temp 97.7 F (36.5 C) (Temporal)   Resp 18   Ht 5\' 5"  (1.651 m)   Wt 166 lb 6 oz (75.5 kg)   LMP 05/17/2017   SpO2 100%   BMI 27.69 kg/m

## 2023-06-26 ENCOUNTER — Ambulatory Visit
Admission: RE | Admit: 2023-06-26 | Discharge: 2023-06-26 | Disposition: A | Discharge status: Home / Self Care | Source: Ambulatory Visit | Attending: Radiation Oncology | Admitting: Radiation Oncology

## 2023-06-26 ENCOUNTER — Encounter: Payer: Self-pay | Admitting: Radiation Oncology

## 2023-06-26 VITALS — BP 146/82 | HR 75 | Temp 97.7°F | Resp 18 | Ht 65.0 in | Wt 166.4 lb

## 2023-06-26 DIAGNOSIS — D0511 Intraductal carcinoma in situ of right breast: Secondary | ICD-10-CM | POA: Insufficient documentation

## 2023-06-26 DIAGNOSIS — Z79899 Other long term (current) drug therapy: Secondary | ICD-10-CM | POA: Insufficient documentation

## 2023-06-26 DIAGNOSIS — Z510 Encounter for antineoplastic radiation therapy: Secondary | ICD-10-CM | POA: Diagnosis present

## 2023-06-26 DIAGNOSIS — Z7984 Long term (current) use of oral hypoglycemic drugs: Secondary | ICD-10-CM | POA: Insufficient documentation

## 2023-06-26 DIAGNOSIS — Z923 Personal history of irradiation: Secondary | ICD-10-CM | POA: Diagnosis not present

## 2023-06-26 DIAGNOSIS — Z171 Estrogen receptor negative status [ER-]: Secondary | ICD-10-CM | POA: Diagnosis not present

## 2023-06-30 DIAGNOSIS — Z510 Encounter for antineoplastic radiation therapy: Secondary | ICD-10-CM | POA: Diagnosis not present

## 2023-07-03 ENCOUNTER — Encounter: Payer: Self-pay | Admitting: *Deleted

## 2023-07-03 ENCOUNTER — Other Ambulatory Visit: Payer: Self-pay

## 2023-07-03 ENCOUNTER — Ambulatory Visit
Admission: RE | Admit: 2023-07-03 | Discharge: 2023-07-03 | Disposition: A | Discharge status: Home / Self Care | Source: Ambulatory Visit | Attending: Radiation Oncology | Admitting: Radiation Oncology

## 2023-07-03 DIAGNOSIS — D0511 Intraductal carcinoma in situ of right breast: Secondary | ICD-10-CM

## 2023-07-03 DIAGNOSIS — Z510 Encounter for antineoplastic radiation therapy: Secondary | ICD-10-CM | POA: Diagnosis not present

## 2023-07-03 LAB — RAD ONC ARIA SESSION SUMMARY
Course Elapsed Days: 0
Plan Fractions Treated to Date: 1
Plan Prescribed Dose Per Fraction: 2.67 Gy
Plan Total Fractions Prescribed: 15
Plan Total Prescribed Dose: 40.05 Gy
Reference Point Dosage Given to Date: 2.67 Gy
Reference Point Session Dosage Given: 2.67 Gy
Session Number: 1

## 2023-07-04 ENCOUNTER — Ambulatory Visit
Admission: RE | Admit: 2023-07-04 | Discharge: 2023-07-04 | Disposition: A | Discharge status: Home / Self Care | Source: Ambulatory Visit | Attending: Radiation Oncology | Admitting: Radiation Oncology

## 2023-07-04 ENCOUNTER — Other Ambulatory Visit: Payer: Self-pay

## 2023-07-04 DIAGNOSIS — Z510 Encounter for antineoplastic radiation therapy: Secondary | ICD-10-CM | POA: Diagnosis not present

## 2023-07-04 LAB — RAD ONC ARIA SESSION SUMMARY
Course Elapsed Days: 1
Plan Fractions Treated to Date: 2
Plan Prescribed Dose Per Fraction: 2.67 Gy
Plan Total Fractions Prescribed: 15
Plan Total Prescribed Dose: 40.05 Gy
Reference Point Dosage Given to Date: 5.34 Gy
Reference Point Session Dosage Given: 2.67 Gy
Session Number: 2

## 2023-07-07 ENCOUNTER — Other Ambulatory Visit: Payer: Self-pay

## 2023-07-07 ENCOUNTER — Ambulatory Visit
Admission: RE | Admit: 2023-07-07 | Discharge: 2023-07-07 | Disposition: A | Discharge status: Home / Self Care | Source: Ambulatory Visit | Attending: Radiation Oncology | Admitting: Radiation Oncology

## 2023-07-07 DIAGNOSIS — Z510 Encounter for antineoplastic radiation therapy: Secondary | ICD-10-CM | POA: Diagnosis not present

## 2023-07-07 LAB — RAD ONC ARIA SESSION SUMMARY
Course Elapsed Days: 4
Plan Fractions Treated to Date: 3
Plan Prescribed Dose Per Fraction: 2.67 Gy
Plan Total Fractions Prescribed: 15
Plan Total Prescribed Dose: 40.05 Gy
Reference Point Dosage Given to Date: 8.01 Gy
Reference Point Session Dosage Given: 2.67 Gy
Session Number: 3

## 2023-07-08 ENCOUNTER — Ambulatory Visit
Admission: RE | Admit: 2023-07-08 | Discharge: 2023-07-08 | Disposition: A | Discharge status: Home / Self Care | Source: Ambulatory Visit | Attending: Radiation Oncology | Admitting: Radiation Oncology

## 2023-07-08 ENCOUNTER — Other Ambulatory Visit: Payer: Self-pay

## 2023-07-08 DIAGNOSIS — D0511 Intraductal carcinoma in situ of right breast: Secondary | ICD-10-CM

## 2023-07-08 DIAGNOSIS — Z510 Encounter for antineoplastic radiation therapy: Secondary | ICD-10-CM | POA: Diagnosis not present

## 2023-07-08 LAB — RAD ONC ARIA SESSION SUMMARY
Course Elapsed Days: 5
Plan Fractions Treated to Date: 4
Plan Prescribed Dose Per Fraction: 2.67 Gy
Plan Total Fractions Prescribed: 15
Plan Total Prescribed Dose: 40.05 Gy
Reference Point Dosage Given to Date: 10.68 Gy
Reference Point Session Dosage Given: 2.67 Gy
Session Number: 4

## 2023-07-08 MED ORDER — RADIAPLEXRX EX GEL: Freq: Once | CUTANEOUS | Status: AC

## 2023-07-08 MED ORDER — ALRA NON-METALLIC DEODORANT (RAD-ONC)
1.0000 | Freq: Once | TOPICAL | Status: AC
  Administered 2023-07-08: 1 via TOPICAL

## 2023-07-08 NOTE — Progress Notes (Signed)
 Pt here for patient teaching. Pt given Radiation and You booklet, skin care instructions, Alra deodorant, and Radiaplex gel.  Reviewed areas of pertinence such as fatigue, hair loss, skin changes, breast tenderness, breast swelling, and taste changes . Pt able to give teach back of to pat skin, use unscented/gentle soap, and drink plenty of water,apply Radiaplex bid, avoid applying anything to skin within 4 hours of treatment, avoid wearing an under wire bra, and to use an electric razor if they must shave. Pt verbalizes understanding of information given and will contact nursing with any questions or concerns.

## 2023-07-09 ENCOUNTER — Ambulatory Visit
Admission: RE | Admit: 2023-07-09 | Discharge: 2023-07-09 | Disposition: A | Discharge status: Home / Self Care | Source: Ambulatory Visit | Attending: Radiation Oncology | Admitting: Radiation Oncology

## 2023-07-09 ENCOUNTER — Other Ambulatory Visit: Payer: Self-pay

## 2023-07-09 DIAGNOSIS — Z510 Encounter for antineoplastic radiation therapy: Secondary | ICD-10-CM | POA: Diagnosis not present

## 2023-07-09 LAB — RAD ONC ARIA SESSION SUMMARY
Course Elapsed Days: 6
Plan Fractions Treated to Date: 5
Plan Prescribed Dose Per Fraction: 2.67 Gy
Plan Total Fractions Prescribed: 15
Plan Total Prescribed Dose: 40.05 Gy
Reference Point Dosage Given to Date: 13.35 Gy
Reference Point Session Dosage Given: 2.67 Gy
Session Number: 5

## 2023-07-10 ENCOUNTER — Other Ambulatory Visit: Payer: Self-pay

## 2023-07-10 ENCOUNTER — Ambulatory Visit
Admission: RE | Admit: 2023-07-10 | Discharge: 2023-07-10 | Disposition: A | Discharge status: Home / Self Care | Source: Ambulatory Visit | Attending: Radiation Oncology | Admitting: Radiation Oncology

## 2023-07-10 DIAGNOSIS — Z510 Encounter for antineoplastic radiation therapy: Secondary | ICD-10-CM | POA: Diagnosis not present

## 2023-07-10 LAB — RAD ONC ARIA SESSION SUMMARY
Course Elapsed Days: 7
Plan Fractions Treated to Date: 6
Plan Prescribed Dose Per Fraction: 2.67 Gy
Plan Total Fractions Prescribed: 15
Plan Total Prescribed Dose: 40.05 Gy
Reference Point Dosage Given to Date: 16.02 Gy
Reference Point Session Dosage Given: 2.67 Gy
Session Number: 6

## 2023-07-11 ENCOUNTER — Ambulatory Visit
Admission: RE | Admit: 2023-07-11 | Discharge: 2023-07-11 | Disposition: A | Discharge status: Home / Self Care | Source: Ambulatory Visit | Attending: Radiation Oncology | Admitting: Radiation Oncology

## 2023-07-11 ENCOUNTER — Other Ambulatory Visit: Payer: Self-pay

## 2023-07-11 DIAGNOSIS — Z510 Encounter for antineoplastic radiation therapy: Secondary | ICD-10-CM | POA: Diagnosis not present

## 2023-07-11 LAB — RAD ONC ARIA SESSION SUMMARY
Course Elapsed Days: 8
Plan Fractions Treated to Date: 7
Plan Prescribed Dose Per Fraction: 2.67 Gy
Plan Total Fractions Prescribed: 15
Plan Total Prescribed Dose: 40.05 Gy
Reference Point Dosage Given to Date: 18.69 Gy
Reference Point Session Dosage Given: 2.67 Gy
Session Number: 7

## 2023-07-14 ENCOUNTER — Ambulatory Visit
Admission: RE | Admit: 2023-07-14 | Discharge: 2023-07-14 | Disposition: A | Discharge status: Home / Self Care | Source: Ambulatory Visit | Attending: Radiation Oncology | Admitting: Radiation Oncology

## 2023-07-14 ENCOUNTER — Other Ambulatory Visit: Payer: Self-pay

## 2023-07-14 DIAGNOSIS — Z510 Encounter for antineoplastic radiation therapy: Secondary | ICD-10-CM | POA: Diagnosis not present

## 2023-07-14 LAB — RAD ONC ARIA SESSION SUMMARY
Course Elapsed Days: 11
Plan Fractions Treated to Date: 8
Plan Prescribed Dose Per Fraction: 2.67 Gy
Plan Total Fractions Prescribed: 15
Plan Total Prescribed Dose: 40.05 Gy
Reference Point Dosage Given to Date: 21.36 Gy
Reference Point Session Dosage Given: 2.67 Gy
Session Number: 8

## 2023-07-15 ENCOUNTER — Ambulatory Visit
Admission: RE | Admit: 2023-07-15 | Discharge: 2023-07-15 | Disposition: A | Discharge status: Home / Self Care | Source: Ambulatory Visit | Attending: Radiation Oncology | Admitting: Radiation Oncology

## 2023-07-15 ENCOUNTER — Other Ambulatory Visit: Payer: Self-pay

## 2023-07-15 DIAGNOSIS — Z510 Encounter for antineoplastic radiation therapy: Secondary | ICD-10-CM | POA: Diagnosis not present

## 2023-07-15 LAB — RAD ONC ARIA SESSION SUMMARY
Course Elapsed Days: 12
Plan Fractions Treated to Date: 9
Plan Prescribed Dose Per Fraction: 2.67 Gy
Plan Total Fractions Prescribed: 15
Plan Total Prescribed Dose: 40.05 Gy
Reference Point Dosage Given to Date: 24.03 Gy
Reference Point Session Dosage Given: 2.67 Gy
Session Number: 9

## 2023-07-16 ENCOUNTER — Other Ambulatory Visit: Payer: Self-pay

## 2023-07-16 ENCOUNTER — Ambulatory Visit
Admission: RE | Admit: 2023-07-16 | Discharge: 2023-07-16 | Disposition: A | Discharge status: Home / Self Care | Source: Ambulatory Visit | Attending: Radiation Oncology | Admitting: Radiation Oncology

## 2023-07-16 DIAGNOSIS — Z510 Encounter for antineoplastic radiation therapy: Secondary | ICD-10-CM | POA: Diagnosis not present

## 2023-07-16 LAB — RAD ONC ARIA SESSION SUMMARY
Course Elapsed Days: 13
Plan Fractions Treated to Date: 10
Plan Prescribed Dose Per Fraction: 2.67 Gy
Plan Total Fractions Prescribed: 15
Plan Total Prescribed Dose: 40.05 Gy
Reference Point Dosage Given to Date: 26.7 Gy
Reference Point Session Dosage Given: 2.67 Gy
Session Number: 10

## 2023-07-17 ENCOUNTER — Other Ambulatory Visit: Payer: Self-pay

## 2023-07-17 ENCOUNTER — Ambulatory Visit
Admission: RE | Admit: 2023-07-17 | Discharge: 2023-07-17 | Disposition: A | Discharge status: Home / Self Care | Source: Ambulatory Visit | Attending: Radiation Oncology | Admitting: Radiation Oncology

## 2023-07-17 DIAGNOSIS — Z510 Encounter for antineoplastic radiation therapy: Secondary | ICD-10-CM | POA: Diagnosis not present

## 2023-07-17 LAB — RAD ONC ARIA SESSION SUMMARY
Course Elapsed Days: 14
Plan Fractions Treated to Date: 11
Plan Prescribed Dose Per Fraction: 2.67 Gy
Plan Total Fractions Prescribed: 15
Plan Total Prescribed Dose: 40.05 Gy
Reference Point Dosage Given to Date: 29.37 Gy
Reference Point Session Dosage Given: 2.67 Gy
Session Number: 11

## 2023-07-18 ENCOUNTER — Ambulatory Visit
Admission: RE | Admit: 2023-07-18 | Discharge: 2023-07-18 | Disposition: A | Discharge status: Home / Self Care | Source: Ambulatory Visit | Attending: Radiation Oncology | Admitting: Radiation Oncology

## 2023-07-18 ENCOUNTER — Other Ambulatory Visit: Payer: Self-pay

## 2023-07-18 DIAGNOSIS — Z510 Encounter for antineoplastic radiation therapy: Secondary | ICD-10-CM | POA: Diagnosis not present

## 2023-07-18 LAB — RAD ONC ARIA SESSION SUMMARY
Course Elapsed Days: 15
Plan Fractions Treated to Date: 12
Plan Prescribed Dose Per Fraction: 2.67 Gy
Plan Total Fractions Prescribed: 15
Plan Total Prescribed Dose: 40.05 Gy
Reference Point Dosage Given to Date: 32.04 Gy
Reference Point Session Dosage Given: 2.67 Gy
Session Number: 12

## 2023-07-22 ENCOUNTER — Ambulatory Visit
Admission: RE | Admit: 2023-07-22 | Discharge: 2023-07-22 | Disposition: A | Discharge status: Home / Self Care | Source: Ambulatory Visit | Attending: Radiation Oncology | Admitting: Radiation Oncology

## 2023-07-22 ENCOUNTER — Ambulatory Visit: Admitting: Radiation Oncology

## 2023-07-22 ENCOUNTER — Other Ambulatory Visit: Payer: Self-pay

## 2023-07-22 DIAGNOSIS — Z510 Encounter for antineoplastic radiation therapy: Secondary | ICD-10-CM | POA: Diagnosis not present

## 2023-07-22 LAB — RAD ONC ARIA SESSION SUMMARY
Course Elapsed Days: 19
Plan Fractions Treated to Date: 13
Plan Prescribed Dose Per Fraction: 2.67 Gy
Plan Total Fractions Prescribed: 15
Plan Total Prescribed Dose: 40.05 Gy
Reference Point Dosage Given to Date: 34.71 Gy
Reference Point Session Dosage Given: 2.67 Gy
Session Number: 13

## 2023-07-23 ENCOUNTER — Ambulatory Visit
Admission: RE | Admit: 2023-07-23 | Discharge: 2023-07-23 | Disposition: A | Discharge status: Home / Self Care | Source: Ambulatory Visit | Attending: Radiation Oncology | Admitting: Radiation Oncology

## 2023-07-23 ENCOUNTER — Other Ambulatory Visit: Payer: Self-pay

## 2023-07-23 DIAGNOSIS — Z510 Encounter for antineoplastic radiation therapy: Secondary | ICD-10-CM | POA: Diagnosis not present

## 2023-07-23 LAB — RAD ONC ARIA SESSION SUMMARY
Course Elapsed Days: 20
Plan Fractions Treated to Date: 14
Plan Prescribed Dose Per Fraction: 2.67 Gy
Plan Total Fractions Prescribed: 15
Plan Total Prescribed Dose: 40.05 Gy
Reference Point Dosage Given to Date: 37.38 Gy
Reference Point Session Dosage Given: 2.67 Gy
Session Number: 14

## 2023-07-24 ENCOUNTER — Ambulatory Visit
Admission: RE | Admit: 2023-07-24 | Discharge: 2023-07-24 | Disposition: A | Discharge status: Home / Self Care | Source: Ambulatory Visit | Attending: Radiation Oncology | Admitting: Radiation Oncology

## 2023-07-24 ENCOUNTER — Other Ambulatory Visit: Payer: Self-pay

## 2023-07-24 DIAGNOSIS — Z510 Encounter for antineoplastic radiation therapy: Secondary | ICD-10-CM | POA: Diagnosis not present

## 2023-07-24 LAB — RAD ONC ARIA SESSION SUMMARY
Course Elapsed Days: 21
Plan Fractions Treated to Date: 15
Plan Prescribed Dose Per Fraction: 2.67 Gy
Plan Total Fractions Prescribed: 15
Plan Total Prescribed Dose: 40.05 Gy
Reference Point Dosage Given to Date: 40.05 Gy
Reference Point Session Dosage Given: 2.67 Gy
Session Number: 15

## 2023-07-25 ENCOUNTER — Ambulatory Visit
Admission: RE | Admit: 2023-07-25 | Discharge: 2023-07-25 | Disposition: A | Discharge status: Home / Self Care | Source: Ambulatory Visit | Attending: Radiation Oncology | Admitting: Radiation Oncology

## 2023-07-25 ENCOUNTER — Other Ambulatory Visit: Payer: Self-pay

## 2023-07-25 DIAGNOSIS — Z510 Encounter for antineoplastic radiation therapy: Secondary | ICD-10-CM | POA: Diagnosis not present

## 2023-07-25 LAB — RAD ONC ARIA SESSION SUMMARY
Course Elapsed Days: 22
Plan Fractions Treated to Date: 1
Plan Prescribed Dose Per Fraction: 2 Gy
Plan Total Fractions Prescribed: 6
Plan Total Prescribed Dose: 12 Gy
Reference Point Dosage Given to Date: 2 Gy
Reference Point Session Dosage Given: 2 Gy
Session Number: 16

## 2023-07-28 ENCOUNTER — Ambulatory Visit

## 2023-07-29 ENCOUNTER — Ambulatory Visit

## 2023-07-29 ENCOUNTER — Other Ambulatory Visit: Payer: Self-pay

## 2023-07-29 ENCOUNTER — Ambulatory Visit
Admission: RE | Admit: 2023-07-29 | Discharge: 2023-07-29 | Disposition: A | Discharge status: Home / Self Care | Source: Ambulatory Visit | Attending: Radiation Oncology | Admitting: Radiation Oncology

## 2023-07-29 ENCOUNTER — Inpatient Hospital Stay (HOSPITAL_BASED_OUTPATIENT_CLINIC_OR_DEPARTMENT_OTHER): Attending: Hematology and Oncology | Admitting: Hematology and Oncology

## 2023-07-29 VITALS — BP 130/58 | HR 92 | Temp 98.0°F | Resp 18 | Wt 168.1 lb

## 2023-07-29 DIAGNOSIS — Z86000 Personal history of in-situ neoplasm of breast: Secondary | ICD-10-CM | POA: Insufficient documentation

## 2023-07-29 DIAGNOSIS — D0511 Intraductal carcinoma in situ of right breast: Secondary | ICD-10-CM | POA: Insufficient documentation

## 2023-07-29 DIAGNOSIS — Z510 Encounter for antineoplastic radiation therapy: Secondary | ICD-10-CM | POA: Insufficient documentation

## 2023-07-29 LAB — RAD ONC ARIA SESSION SUMMARY
Course Elapsed Days: 26
Plan Fractions Treated to Date: 2
Plan Prescribed Dose Per Fraction: 2 Gy
Plan Total Fractions Prescribed: 6
Plan Total Prescribed Dose: 12 Gy
Reference Point Dosage Given to Date: 4 Gy
Reference Point Session Dosage Given: 2 Gy
Session Number: 17

## 2023-07-29 NOTE — Assessment & Plan Note (Signed)
 05/01/2023:Screening mammogram detected right breast asymmetry which resolved, bilateral calcifications Right calcifications 2.3 cm: Anterior and posterior biopsies: High-grade DCIS ER 0%, PR 0% Left calcifications 2 groups: Stereotactic biopsy on 05/08/2023  06/06/2023: left Lumpectomy: HG DCIS solid, papillary and cribriform types with necrosis, Margins Neg, ER 0%, PR 0%  Recommendation: 1. Adjuvant radiation therapy 2. No role of antiestrogen therapy since the ER/PR are 0%.  (We will await the results of the left breast biopsy to determine adjuvant treatment plan for the left breast calcifications)    Return to clinic after radiation

## 2023-07-29 NOTE — Progress Notes (Signed)
 Patient Care Team: Pa, [facility] as PCP - General Mezer, Gwen Lek, MD as Consulting Physician (Gynecology) Auther Bo, RN as Oncology Nurse Navigator Alane Hsu, RN as Oncology Nurse Navigator Cameron Cea, MD as Consulting Physician (Hematology and Oncology) Retta Caster, MD as Consulting Physician (Radiation Oncology) Caralyn Chandler, MD as Consulting Physician (General Surgery)  DIAGNOSIS:  Encounter Diagnosis  Name Primary?   Ductal carcinoma in situ (DCIS) of right breast Yes    SUMMARY OF ONCOLOGIC HISTORY: Oncology History  Ductal carcinoma in situ (DCIS) of right breast  05/01/2023 Initial Diagnosis   Screening mammogram detected right breast asymmetry which resolved, bilateral calcifications Right calcifications 2.3 cm: Anterior and posterior biopsies: High-grade DCIS ER 0%, PR 0% Left calcifications 2 groups: Stereotactic biopsy on 05/08/2023   05/07/2023 Cancer Staging   Staging form: Breast, AJCC 8th Edition - Clinical: Stage 0 (cTis (DCIS), cN0, cM0, G3, ER-, PR-, HER2: Not Assessed) - Signed by Cameron Cea, MD on 05/07/2023 Histologic grading system: 3 grade system    Genetic Testing   Non-diagnostic [facility] CancerNext-Expanded +RNAinsight panel. VUS in BRIP1, c.3196delT and DICER1, p.[id number]. The CancerNext-Expanded gene panel offered by [facility] and includes sequencing, rearrangement, and RNA analysis for the following 76 genes: AIP, ALK, APC, ATM, AXIN2, BAP1, BARD1, BMPR1A, BRCA1, BRCA2, BRIP1, CDC73, CDH1, CDK4, CDKN1B, CDKN2A, CEBPA, CHEK2, CTNNA1, DDX41, DICER1, ETV6, FH, FLCN, GATA2, LZTR1, MAX, MBD4, MEN1, MET, MLH1, MSH2, MSH3, MSH6, MUTYH, NF1, NF2, NTHL1, PALB2, PHOX2B, PMS2, POT1, PRKAR1A, PTCH1, PTEN, RAD51C, RAD51D, RB1, RET, RUNX1, SDHA, SDHAF2, SDHB, SDHC, SDHD, SMAD4, SMARCA4, SMARCB1, SMARCE1, STK11, SUFU, TMEM127, TP53, TSC1, TSC2, VHL, and WT1 (sequencing and deletion/duplication); EGFR, HOXB13, KIT, MITF, PDGFRA, POLD1,  and POLE (sequencing only); EPCAM and GREM1 (deletion/duplication only). Report date 05/19/23.      CHIEF COMPLIANT: Follow-up towards end of radiation  HISTORY OF PRESENT ILLNESS:  History of Present Illness [name] [name] is a 58 year old female with ductal carcinoma in situ of the right breast who presents for follow-up after completing radiation therapy.  She is nearing the completion of her radiation therapy for ductal carcinoma in situ of the right breast, with the last session expected on the ninth. The treatment has been well-tolerated. Her cancer is estrogen receptor-negative, and biopsies on the left side are benign, indicating no need for hormone therapy post-radiation.     ALLERGIES:  has no known allergies.  MEDICATIONS:  Current Outpatient Medications  Medication Sig Dispense Refill   amLODipine  (NORVASC ) 5 MG tablet TAKE 1 TABLET(5 MG) BY MOUTH DAILY 30 tablet 0   metFORMIN (GLUCOPHAGE) 500 MG tablet Take 500 mg by mouth 2 (two) times daily.     Multiple Vitamin (MULTIVITAMIN) tablet Take 1 tablet by mouth daily.     VITAMIN D, ERGOCALCIFEROL, PO Take 25 mcg by mouth daily.     oxyCODONE  (ROXICODONE ) 5 MG immediate release tablet Take 1 tablet (5 mg total) by mouth every 6 (six) hours as needed for severe pain (pain score 7-10). (Patient not taking: Reported on 07/29/2023) 10 tablet 0   Current Facility-Administered Medications  Medication Dose Route Frequency Provider Last Rate Last Admin   0.9 %  sodium chloride  infusion  500 mL Intravenous Once Danis, Henry L III, MD        PHYSICAL EXAMINATION: ECOG PERFORMANCE STATUS: 1 - Symptomatic but completely ambulatory  Vitals:   07/29/23 1501  BP: (!) 130/58  Pulse: 92  Resp: 18  Temp: 98 F (36.7 C)  SpO2: 99%   Filed Weights   07/29/23 1501  Weight: 168 lb 1.6 oz (76.2 kg)    Physical Exam   (exam performed in the presence of a chaperone)  LABORATORY DATA:  I have reviewed the data as listed    Latest  Ref Rng & Units 05/07/2023   12:39 PM 08/29/2016    3:08 PM 03/13/2012    9:25 AM  CMP  Glucose 70 - 99 mg/dL 92  518  94   BUN 6 - 20 mg/dL 20  17  11    Creatinine 0.44 - 1.00 mg/dL 8.41  6.60  6.30   Sodium 135 - 145 mmol/L 139  136  136   Potassium 3.5 - 5.1 mmol/L 4.2  4.0  4.3   Chloride 98 - 111 mmol/L 105  102  102   CO2 22 - 32 mmol/L 30  28  25    Calcium 8.9 - 10.3 mg/dL 9.0  9.2  9.7   Total Protein 6.5 - 8.1 g/dL 6.5  6.7  7.1   Total Bilirubin 0.0 - 1.2 mg/dL 0.3  0.2  0.4   Alkaline Phos 38 - 126 U/L 59  41  45   AST 15 - 41 U/L 14  17  15    ALT 0 - 44 U/L 12  17  16      Lab Results  Component Value Date   WBC 7.9 05/07/2023   HGB 10.9 (L) 05/07/2023   HCT 34.5 (L) 05/07/2023   MCV 88.9 05/07/2023   PLT 327 05/07/2023   NEUTROABS 3.7 05/07/2023    ASSESSMENT & PLAN:  Ductal carcinoma in situ (DCIS) of right breast 05/01/2023:Screening mammogram detected right breast asymmetry which resolved, bilateral calcifications Right calcifications 2.3 cm: Anterior and posterior biopsies: High-grade DCIS ER 0%, PR 0% Left calcifications 2 groups: Stereotactic biopsy on 05/08/2023: Benign  06/06/2023: left Lumpectomy: HG DCIS solid, papillary and cribriform types with necrosis, Margins Neg, ER 0%, PR 0%  Recommendation: 1. Adjuvant radiation therapy: 07/04/2023-08/04/2023 2. No role of antiestrogen therapy since the ER/PR are 0%.    Return to clinic in 3 months for survivorship care plan visit. After that she could be followed annually at long-term survivorship clinic.     No orders of the defined types were placed in this encounter.  The patient has a good understanding of the overall plan. she agrees with it. she will call with any problems that may develop before the next visit here. Total time spent: 30 mins including face to face time and time spent for planning, charting and co-ordination of care   Viinay K Chayil Gantt, MD 07/29/23

## 2023-07-30 ENCOUNTER — Other Ambulatory Visit: Payer: Self-pay

## 2023-07-30 ENCOUNTER — Ambulatory Visit
Admission: RE | Admit: 2023-07-30 | Discharge: 2023-07-30 | Disposition: A | Discharge status: Home / Self Care | Source: Ambulatory Visit | Attending: Radiation Oncology | Admitting: Radiation Oncology

## 2023-07-30 DIAGNOSIS — Z510 Encounter for antineoplastic radiation therapy: Secondary | ICD-10-CM | POA: Diagnosis not present

## 2023-07-30 LAB — RAD ONC ARIA SESSION SUMMARY
Course Elapsed Days: 27
Plan Fractions Treated to Date: 3
Plan Prescribed Dose Per Fraction: 2 Gy
Plan Total Fractions Prescribed: 6
Plan Total Prescribed Dose: 12 Gy
Reference Point Dosage Given to Date: 6 Gy
Reference Point Session Dosage Given: 2 Gy
Session Number: 18

## 2023-07-31 ENCOUNTER — Other Ambulatory Visit: Payer: Self-pay

## 2023-07-31 ENCOUNTER — Ambulatory Visit
Admission: RE | Admit: 2023-07-31 | Discharge: 2023-07-31 | Disposition: A | Discharge status: Home / Self Care | Source: Ambulatory Visit | Attending: Radiation Oncology | Admitting: Radiation Oncology

## 2023-07-31 DIAGNOSIS — Z510 Encounter for antineoplastic radiation therapy: Secondary | ICD-10-CM | POA: Diagnosis not present

## 2023-07-31 LAB — RAD ONC ARIA SESSION SUMMARY
Course Elapsed Days: 28
Plan Fractions Treated to Date: 4
Plan Prescribed Dose Per Fraction: 2 Gy
Plan Total Fractions Prescribed: 6
Plan Total Prescribed Dose: 12 Gy
Reference Point Dosage Given to Date: 8 Gy
Reference Point Session Dosage Given: 2 Gy
Session Number: 19

## 2023-08-01 ENCOUNTER — Ambulatory Visit
Admission: RE | Admit: 2023-08-01 | Discharge: 2023-08-01 | Disposition: A | Discharge status: Home / Self Care | Source: Ambulatory Visit | Attending: Radiation Oncology | Admitting: Radiation Oncology

## 2023-08-01 ENCOUNTER — Other Ambulatory Visit: Payer: Self-pay

## 2023-08-01 ENCOUNTER — Ambulatory Visit

## 2023-08-01 DIAGNOSIS — Z510 Encounter for antineoplastic radiation therapy: Secondary | ICD-10-CM | POA: Diagnosis not present

## 2023-08-01 LAB — RAD ONC ARIA SESSION SUMMARY
Course Elapsed Days: 29
Plan Fractions Treated to Date: 5
Plan Prescribed Dose Per Fraction: 2 Gy
Plan Total Fractions Prescribed: 6
Plan Total Prescribed Dose: 12 Gy
Reference Point Dosage Given to Date: 10 Gy
Reference Point Session Dosage Given: 2 Gy
Session Number: 20

## 2023-08-04 ENCOUNTER — Other Ambulatory Visit: Payer: Self-pay

## 2023-08-04 ENCOUNTER — Ambulatory Visit
Admission: RE | Admit: 2023-08-04 | Discharge: 2023-08-04 | Disposition: A | Discharge status: Home / Self Care | Source: Ambulatory Visit | Attending: Radiation Oncology | Admitting: Radiation Oncology

## 2023-08-04 DIAGNOSIS — Z510 Encounter for antineoplastic radiation therapy: Secondary | ICD-10-CM | POA: Diagnosis not present

## 2023-08-04 LAB — RAD ONC ARIA SESSION SUMMARY
Course Elapsed Days: 32
Plan Fractions Treated to Date: 6
Plan Prescribed Dose Per Fraction: 2 Gy
Plan Total Fractions Prescribed: 6
Plan Total Prescribed Dose: 12 Gy
Reference Point Dosage Given to Date: 12 Gy
Reference Point Session Dosage Given: 2 Gy
Session Number: 21

## 2023-08-05 NOTE — Radiation Completion Notes (Addendum)
  Radiation Oncology         (336) [phone] ________________________________  Name: [name] [name] MRN: [id number]  Date of Service: 08/04/2023  DOB: March 19, 1965  End of Treatment Note  Diagnosis: Stage 0 (cTis, N0, M0) Ductal Carcinoma in Situ of the right breast, ER- / PR-,Grade 3  Intent: Curative     ==========DELIVERED PLANS==========  First Treatment Date: 2023-07-03 Last Treatment Date: 2023-08-04   Plan Name: Breast_R Site: Breast, Right Technique: 3D Mode: Photon Dose Per Fraction: 2.67 Gy Prescribed Dose (Delivered / Prescribed): 40.05 Gy / 40.05 Gy Prescribed Fxs (Delivered / Prescribed): 15 / 15   Plan Name: Breast_Rt_Bst Site: Breast, Right Technique: 3D Mode: Photon Dose Per Fraction: 2 Gy Prescribed Dose (Delivered / Prescribed): 12 Gy / 12 Gy Prescribed Fxs (Delivered / Prescribed): 6 / 6     ====================================   The patient tolerated radiation. She developed fatigue and anticipated skin changes in the treatment field.   The patient [name] return in one month and [name] continue follow up with Davied Estelle, NP in our survivorship clinic as well.      Amiel Kalata, PA-C

## 2023-08-26 ENCOUNTER — Telehealth: Payer: Self-pay | Admitting: Radiation Oncology

## 2023-08-26 NOTE — Telephone Encounter (Signed)
 LVM to advise pt of sooner appts times available for 7/3

## 2023-08-27 ENCOUNTER — Encounter: Payer: Self-pay | Admitting: Radiation Oncology

## 2023-08-28 ENCOUNTER — Ambulatory Visit
Admission: RE | Admit: 2023-08-28 | Discharge: 2023-08-28 | Disposition: A | Discharge status: Home / Self Care | Source: Ambulatory Visit | Attending: Radiation Oncology | Admitting: Radiation Oncology

## 2023-08-28 ENCOUNTER — Encounter: Payer: Self-pay | Admitting: Radiation Oncology

## 2023-08-28 VITALS — BP 133/68 | HR 68 | Temp 98.4°F | Resp 17 | Wt 171.4 lb

## 2023-08-28 DIAGNOSIS — D0511 Intraductal carcinoma in situ of right breast: Secondary | ICD-10-CM

## 2023-08-28 HISTORY — DX: Personal history of irradiation: Z92.3

## 2023-08-28 NOTE — Progress Notes (Incomplete)
 Radiation Oncology         (336) [phone] ________________________________  Name: [name] [name] MRN: [id number]  Date: 08/28/2023  DOB: 1965-09-27  Follow-Up Visit Note  CC: Pa, [facility]  [facility], [facility]  No diagnosis found.  Diagnosis:   Stage 0 (cTis, N0, M0) Ductal Carcinoma in Situ of the right breast, ER- / PR-,Grade 3   Interval Since Last Radiation:  24 days  Intent: curative First Treatment Date: 2023-07-03 Last Treatment Date: 2023-08-04   Plan Name: Breast_R Site: Breast, Right Technique: 3D Mode: Photon Dose Per Fraction: 2.67 Gy Prescribed Dose (Delivered / Prescribed): 40.05 Gy / 40.05 Gy Prescribed Fxs (Delivered / Prescribed): 15 / 15   Plan Name: Breast_Rt_Bst Site: Breast, Right Technique: 3D Mode: Photon Dose Per Fraction: 2 Gy Prescribed Dose (Delivered / Prescribed): 12 Gy / 12 Gy Prescribed Fxs (Delivered / Prescribed): 6 / 6  Narrative:  The patient returns today for routine follow-up. She was last seen in office on 06/26/23 for a consult visit. Since then, patient completed her radiation treatment which she tolerated quite well. Patient did however endorse experiencing fatigue and anticipated skin changes in the treatment field.   In the interval since she was last seen, patient presented for a follow up with Dr. Gudena on 07/29/23. At that time, her her biopsies were reviewed indicating estrogen receptor is negative, and biopsies on the left side are benign, thus there's no need for hormone therapy post-radiation.      No other significant oncologic interval history since the patient was last seen.                              ***  Allergies:  has no known allergies.  Meds: Current Outpatient Medications  Medication Sig Dispense Refill   amLODipine  (NORVASC ) 5 MG tablet TAKE 1 TABLET(5 MG) BY MOUTH DAILY 30 tablet 0   metFORMIN (GLUCOPHAGE) 500 MG tablet Take 500 mg by mouth 2 (two) times daily.     Multiple Vitamin  (MULTIVITAMIN) tablet Take 1 tablet by mouth daily.     oxyCODONE  (ROXICODONE ) 5 MG immediate release tablet Take 1 tablet (5 mg total) by mouth every 6 (six) hours as needed for severe pain (pain score 7-10). (Patient not taking: Reported on 07/29/2023) 10 tablet 0   VITAMIN D, ERGOCALCIFEROL, PO Take 25 mcg by mouth daily.     Current Facility-Administered Medications  Medication Dose Route Frequency Provider Last Rate Last Admin   0.9 %  sodium chloride  infusion  500 mL Intravenous Once Danis, Henry L III, MD        Physical Findings: The patient is in no acute distress. Patient is alert and oriented.  vitals were not taken for this visit. .  No significant changes. Lungs are clear to auscultation bilaterally. Heart has regular rate and rhythm. No palpable cervical, supraclavicular, or axillary adenopathy. Abdomen soft, non-tender, normal bowel sounds.  Left breast: No breast mass, dimpling, or skin changes Right breast:***  Lab Findings: Lab Results  Component Value Date   WBC 7.9 05/07/2023   HGB 10.9 (L) 05/07/2023   HCT 34.5 (L) 05/07/2023   MCV 88.9 05/07/2023   PLT 327 05/07/2023    Radiographic Findings: No results found.  Impression: Stage 0 (cTis, N0, M0) Ductal Carcinoma in Situ of the right breast, ER- / PR-,Grade 3   The patient is doing well overall.  She has healed well since  completing her radiation treatment.  Plan: She is scheduled to see Morna Kendall in survivorship clinic on 10/30/2023.  Radiation follow-up as needed.  We appreciate the opportunity to take part in this patient's care.  She was encouraged to call with any questions or concerns   *** minutes of total time was spent for this patient encounter, including preparation, face-to-face counseling with the patient and coordination of care, physical exam, and documentation of the encounter. ____________________________________   Leeroy Due, PA-C   This document serves as a record of services  personally performed by Lynwood Nasuti, MD. It was created on his behalf by Reymundo Cartwright, a trained medical scribe. The creation of this record is based on the scribe's personal observations and the provider's statements to them. This document has been checked and approved by the attending provider.

## 2023-08-28 NOTE — Progress Notes (Signed)
 [name] [name] is here today for follow up post radiation to the breast.   Breast Side:Right    They completed their radiation on: 08/04/23   Does the patient complain of any of the following: Post radiation skin issues:  Skin has greatly improved.  Breast Tenderness: No Breast Swelling: No Lymphadema: No Range of Motion limitations: No Fatigue post radiation: No Appetite good/fair/poor: Good  Additional comments if applicable:   BP 133/68 (BP Location: Left Arm, Patient Position: Sitting, Cuff Size: Normal)   Pulse 68   Temp 98.4 F (36.9 C) (Oral)   Resp 17   Wt 171 lb 6 oz (77.7 kg)   LMP 05/17/2017   SpO2 100%   BMI 28.52 kg/m

## 2023-10-30 ENCOUNTER — Encounter: Payer: Self-pay | Admitting: Adult Health

## 2023-10-30 ENCOUNTER — Inpatient Hospital Stay: Attending: Radiation Oncology | Admitting: Adult Health

## 2023-10-30 VITALS — BP 124/58 | HR 79 | Temp 98.5°F | Resp 18 | Wt 175.4 lb

## 2023-10-30 DIAGNOSIS — Z923 Personal history of irradiation: Secondary | ICD-10-CM | POA: Insufficient documentation

## 2023-10-30 DIAGNOSIS — Z9189 Other specified personal risk factors, not elsewhere classified: Secondary | ICD-10-CM | POA: Diagnosis not present

## 2023-10-30 DIAGNOSIS — R5383 Other fatigue: Secondary | ICD-10-CM | POA: Insufficient documentation

## 2023-10-30 DIAGNOSIS — D0511 Intraductal carcinoma in situ of right breast: Secondary | ICD-10-CM | POA: Diagnosis not present

## 2023-10-30 NOTE — Progress Notes (Signed)
 SURVIVORSHIP VISIT:  BRIEF ONCOLOGIC HISTORY:  Oncology History  Ductal carcinoma in situ (DCIS) of right breast  05/01/2023 Initial Diagnosis   Screening mammogram detected right breast asymmetry which resolved, bilateral calcifications Right calcifications 2.3 cm: Anterior and posterior biopsies: High-grade DCIS ER 0%, PR 0% Left calcifications 2 groups: Stereotactic biopsy on 05/08/2023   05/07/2023 Cancer Staging   Staging form: Breast, AJCC 8th Edition - Clinical: Stage 0 (cTis (DCIS), cN0, cM0, G3, ER-, PR-, HER2: Not Assessed) - Signed by Odean Potts, MD on 05/07/2023 Histologic grading system: 3 grade system    Genetic Testing   Non-diagnostic [facility] CancerNext-Expanded +RNAinsight panel. VUS in BRIP1, c.3196delT and DICER1, p.C1641W. The CancerNext-Expanded gene panel offered by [facility] and includes sequencing, rearrangement, and RNA analysis for the following 76 genes: AIP, ALK, APC, ATM, AXIN2, BAP1, BARD1, BMPR1A, BRCA1, BRCA2, BRIP1, CDC73, CDH1, CDK4, CDKN1B, CDKN2A, CEBPA, CHEK2, CTNNA1, DDX41, DICER1, ETV6, FH, FLCN, GATA2, LZTR1, MAX, MBD4, MEN1, MET, MLH1, MSH2, MSH3, MSH6, MUTYH, NF1, NF2, NTHL1, PALB2, PHOX2B, PMS2, POT1, PRKAR1A, PTCH1, PTEN, RAD51C, RAD51D, RB1, RET, RUNX1, SDHA, SDHAF2, SDHB, SDHC, SDHD, SMAD4, SMARCA4, SMARCB1, SMARCE1, STK11, SUFU, TMEM127, TP53, TSC1, TSC2, VHL, and WT1 (sequencing and deletion/duplication); EGFR, HOXB13, KIT, MITF, PDGFRA, POLD1, and POLE (sequencing only); EPCAM and GREM1 (deletion/duplication only). Report date 05/19/23.    07/03/2023 - 08/04/2023 Radiation Therapy   Plan Name: Breast_R Site: Breast, Right Technique: 3D Mode: Photon Dose Per Fraction: 2.67 Gy Prescribed Dose (Delivered / Prescribed): 40.05 Gy / 40.05 Gy Prescribed Fxs (Delivered / Prescribed): 15 / 15   Plan Name: Breast_Rt_Bst Site: Breast, Right Technique: 3D Mode: Photon Dose Per Fraction: 2 Gy Prescribed Dose (Delivered / Prescribed): 12 Gy / 12  Gy Prescribed Fxs (Delivered / Prescribed): 6 / 6     INTERVAL HISTORY:  Discussed the use of AI scribe software for clinical note transcription with the patient, who gave verbal consent to proceed.  History of Present Illness [name] [name] is a 58 year old female with ductal carcinoma in situ who presents for follow-up after completing radiation therapy.  She completed radiation therapy for ductal carcinoma in situ in her right breast. She experiences occasional breast pain, described as intermittent and mild. A lumpectomy was performed without lymph node removal. Her breast is sometimes tender, and she uses a compression bra for relief.  She manages fatigue by taking small walks during lunch at work. She is improving her diet by incorporating more fruits and vegetables and continues to consume chicken and fish, though less frequently.    REVIEW OF SYSTEMS:  Review of Systems  Constitutional:  Positive for fatigue. Negative for appetite change, chills, fever and unexpected weight change.  HENT:   Negative for hearing loss, lump/mass and trouble swallowing.   Eyes:  Negative for eye problems and icterus.  Respiratory:  Negative for chest tightness, cough and shortness of breath.   Cardiovascular:  Negative for chest pain, leg swelling and palpitations.  Gastrointestinal:  Negative for abdominal distention, abdominal pain, constipation, diarrhea, nausea and vomiting.  Endocrine: Negative for hot flashes.  Genitourinary:  Negative for difficulty urinating.   Musculoskeletal:  Negative for arthralgias.  Skin:  Negative for itching and rash.  Neurological:  Negative for dizziness, extremity weakness, headaches and numbness.  Hematological:  Negative for adenopathy. Does not bruise/bleed easily.  Psychiatric/Behavioral:  Negative for depression. The patient is not nervous/anxious.    Breast: Denies any new nodularity, masses, tenderness, nipple changes, or nipple discharge.  PAST MEDICAL/SURGICAL HISTORY:  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   Diabetes mellitus without complication (HCC)    Hematuria    History of radiation therapy    Right breast-07/03/23-08/04/23- Dr. Lynwood Nasuti   Hypertension    LBBB (left bundle branch block)    Past Surgical History:  Procedure Laterality Date   BREAST BIOPSY Right 05/01/2023   MM RT BREAST BX W LOC DEV 1ST LESION IMAGE BX SPEC STEREO GUIDE 05/01/2023 [facility]   BREAST BIOPSY Right 05/01/2023   MM RT BREAST BX W LOC DEV EA AD LESION IMG BX SPEC STEREO GUIDE 05/01/2023 [facility]   BREAST BIOPSY Left 05/08/2023   MM LT BREAST BX W LOC DEV EA AD LESION IMG BX SPEC STEREO GUIDE 05/08/2023 [facility]   BREAST BIOPSY Left 05/08/2023   MM LT BREAST BX W LOC DEV 1ST LESION IMAGE BX SPEC STEREO GUIDE 05/08/2023 [facility]   BREAST BIOPSY  06/05/2023   MM RT RADIOACTIVE SEED LOC MAMMO GUIDE 06/05/2023 [facility]   BREAST LUMPECTOMY WITH RADIOACTIVE SEED LOCALIZATION Right 06/06/2023   Procedure: BREAST LUMPECTOMY WITH RADIOACTIVE SEED LOCALIZATION;  Surgeon: Curvin Deward MOULD, MD;  Location: [facility];  Service: General;  Laterality: Right;   TUBAL LIGATION  1994     ALLERGIES:  No Known Allergies   CURRENT MEDICATIONS:  Outpatient Encounter Medications as of 10/30/2023  Medication Sig Note   amLODipine  (NORVASC ) 5 MG tablet TAKE 1 TABLET(5 MG) BY MOUTH DAILY    metFORMIN (GLUCOPHAGE) 500 MG tablet Take 500 mg by mouth 2 (two) times daily.    Multiple Vitamin (MULTIVITAMIN) tablet Take 1 tablet by mouth daily. 05/23/2023: On hold   oxyCODONE  (ROXICODONE ) 5 MG immediate release tablet Take 1 tablet (5 mg total) by mouth every 6 (six) hours as needed for severe pain (pain score 7-10). (Patient not taking: Reported on 07/29/2023)    VITAMIN D, ERGOCALCIFEROL, PO Take 25 mcg by mouth  daily. 05/23/2023: On hold   Facility-Administered Encounter Medications as of 10/30/2023  Medication   0.9 %  sodium chloride  infusion     ONCOLOGIC FAMILY HISTORY:  Family History  Problem Relation Age of Onset   Cancer Mother        colon cancer   Hypertension Mother    Heart attack Father    Diabetes Sister    Hyperlipidemia Sister    Cancer Brother        lung cancer   Hypertension Brother    Cancer Brother        mylema   Urolithiasis Brother    Lung cancer Brother    Lung cancer Maternal Uncle    Kidney disease Neg Hx    Stroke Neg Hx    Colon cancer Neg Hx    Colon polyps Neg Hx    Esophageal cancer Neg Hx    Stomach cancer Neg Hx    Rectal cancer Neg Hx      SOCIAL HISTORY:  Social History   Socioeconomic History   Marital status: Significant Other    Spouse name: Not on file   Number of children: 2   Years of education: Not on file   Highest education level: Not on file  Occupational History    Employer: [employer]   Tobacco Use   Smoking status:  Never   Smokeless tobacco: Never  Substance and Sexual Activity   Alcohol use: Yes    Alcohol/week: 10.0 standard drinks of alcohol    Types: 10 Standard drinks or equivalent per week    Comment: 2-3 glass wine/day,    Drug use: No   Sexual activity: Yes    Birth control/protection: Post-menopausal, Surgical    Comment: BTL  Other Topics Concern   Not on file  Social History Narrative   REGULAR EXERCISE:  3 X WEEKLY   CAFFEINE USE:  NO   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   Divorced   2 children (age 34 and 20)            Social Drivers of Corporate investment banker Strain: Not on file  Food Insecurity: No Food Insecurity (06/26/2023)   Hunger Vital Sign    Worried About Running Out of Food in the Last Year: Never true    Ran Out of Food in the Last Year: Never true  Transportation Needs: No Transportation Needs (06/26/2023)   PRAPARE - Scientist, research (physical sciences) (Medical): No    Lack of Transportation (Non-Medical): No  Physical Activity: Not on file  Stress: Not on file  Social Connections: Unknown (07/10/2021)   [facility]   Social Network    Social Network: Not on file  Intimate Partner Violence: Not At Risk (06/26/2023)   Humiliation, Afraid, Rape, and Kick questionnaire    Fear of Current or Ex-Partner: No    Emotionally Abused: No    Physically Abused: No    Sexually Abused: No     OBSERVATIONS/OBJECTIVE:  LMP 05/17/2017  GENERAL: Patient is a well appearing female in no acute distress HEENT:  Sclerae anicteric.  Oropharynx clear and moist. No ulcerations or evidence of oropharyngeal candidiasis. Neck is supple.  NODES:  No cervical, supraclavicular, or axillary lymphadenopathy palpated.  BREAST EXAM: Right breast status postlumpectomy and radiation no sign of local recurrence left breast is benign. LUNGS:  Clear to auscultation bilaterally.  No wheezes or rhonchi. HEART:  Regular rate and rhythm. No murmur appreciated. ABDOMEN:  Soft, nontender.  Positive, normoactive bowel sounds. No organomegaly palpated. MSK:  No focal spinal tenderness to palpation. Full range of motion bilaterally in the upper extremities. EXTREMITIES:  No peripheral edema.   SKIN:  Clear with no obvious rashes or skin changes. No nail dyscrasia. NEURO:  Nonfocal. Well oriented.  Appropriate affect.   LABORATORY DATA:  None for this visit.  DIAGNOSTIC IMAGING:  None for this visit.      ASSESSMENT AND PLAN:  [name] [name] is a pleasant 58 y.o. female with Stage 0 left breast DCIS, ER-/PR-, diagnosed in 04/2023, treated with lumpectomy & adjuvant radiation therapy.  She presents to the Survivorship Clinic for our initial meeting and routine follow-up post-completion of treatment for breast cancer.    1. Stage 0 left breast cancer:  [name] [name] is continuing to recover from definitive treatment for breast cancer. She will  follow-up with me in one year with history and physical exam per surveillance protocol. Her mammogram is due 03/2024; orders placed today.  She would like to pursue breast MRI in 09/2024 as her breast cancer recurrence risk is elevated due to her ER/PR negative DCIS status.    Today, a comprehensive survivorship care plan and treatment summary was reviewed with the patient today detailing her breast cancer diagnosis, treatment course, potential late/long-term effects of treatment,  appropriate follow-up care with recommendations for the future, and patient education resources.  A copy of this summary, along with a letter will be sent to the patient's primary care provider via mail/fax/In Basket message after today's visit.    2. Bone health:  She was given education on specific activities to promote bone health.  3. Cancer screening:  Due to [name] [name]'s history and her age, she should receive screening for skin cancers, colon cancer, and gynecologic cancers.  The information and recommendations are listed on the patient's comprehensive care plan/treatment summary and were reviewed in detail with the patient.    4. Health maintenance and wellness promotion: [name] [name] was encouraged to consume 5-7 servings of fruits and vegetables per day. We reviewed the [employer] handout.  She was also encouraged to engage in moderate to vigorous exercise for 30 minutes per day most days of the week.  She was instructed to limit her alcohol consumption and continue to abstain from tobacco use.     5. Support services/counseling: It is not uncommon for this period of the patient's cancer care trajectory to be one of many emotions and stressors. She was given information regarding our available services and encouraged to contact me with any questions or for help enrolling in any of our support group/programs.    Follow up instructions:    -Return to cancer center in 1 year for annual f/u  -Mammogram due in  03/2024 - Follow up with [facility] 05/2024 -She is welcome to return back to the Survivorship Clinic at any time; no additional follow-up needed at this time.  -Consider referral back to survivorship as a long-term survivor for continued surveillance  The patient was provided an opportunity to ask questions and all were answered. The patient agreed with the plan and demonstrated an understanding of the instructions.   Total encounter time:45 minutes*in face-to-face visit time, chart review, lab review, care coordination, order entry, and documentation of the encounter time.    Morna Kendall, NP 10/30/23 1:06 PM Medical Oncology and Hematology [facility] [address] [place], [place] [address] Tel. [phone]    Fax. [phone]  *Total Encounter Time as defined by the Centers for Medicare and Medicaid Services includes, in addition to the face-to-face time of a patient visit (documented in the note above) non-face-to-face time: obtaining and reviewing outside history, ordering and reviewing medications, tests or procedures, care coordination (communications with other health care professionals or caregivers) and documentation in the medical record.

## 2024-04-21 ENCOUNTER — Encounter

## 2024-05-28 ENCOUNTER — Inpatient Hospital Stay: Admitting: Adult Health
